# Patient Record
Sex: Female | Born: 1965
Health system: Southern US, Community
[De-identification: ages and names within clinical notes are randomized; demographics above are authoritative.]

## PROBLEM LIST (undated history)

## (undated) DIAGNOSIS — I639 Cerebral infarction, unspecified: Secondary | ICD-10-CM

## (undated) DIAGNOSIS — E119 Type 2 diabetes mellitus without complications: Secondary | ICD-10-CM

## (undated) DIAGNOSIS — E7800 Pure hypercholesterolemia, unspecified: Secondary | ICD-10-CM

## (undated) DIAGNOSIS — I10 Essential (primary) hypertension: Secondary | ICD-10-CM

## (undated) HISTORY — DX: Pure hypercholesterolemia, unspecified: E78.00

## (undated) HISTORY — DX: Type 2 diabetes mellitus without complications: E11.9

## (undated) HISTORY — PX: TUBAL LIGATION: SHX77

## (undated) HISTORY — DX: Cerebral infarction, unspecified: I63.9

## (undated) HISTORY — DX: Essential (primary) hypertension: I10

---

## 2015-01-08 ENCOUNTER — Other Ambulatory Visit: Payer: Self-pay | Admitting: Internal Medicine

## 2015-01-08 DIAGNOSIS — R1011 Right upper quadrant pain: Secondary | ICD-10-CM

## 2015-01-09 ENCOUNTER — Ambulatory Visit
Admission: RE | Admit: 2015-01-09 | Discharge: 2015-01-09 | Disposition: A | Discharge status: Home / Self Care | Payer: Self-pay | Source: Ambulatory Visit | Attending: Internal Medicine | Admitting: Internal Medicine

## 2015-01-09 DIAGNOSIS — R1011 Right upper quadrant pain: Secondary | ICD-10-CM

## 2015-01-15 ENCOUNTER — Other Ambulatory Visit: Payer: Self-pay

## 2015-04-08 ENCOUNTER — Ambulatory Visit: Payer: Federal, State, Local not specified - PPO | Admitting: Dietician

## 2015-04-10 ENCOUNTER — Ambulatory Visit: Payer: Federal, State, Local not specified - PPO | Admitting: Skilled Nursing Facility1

## 2015-04-20 ENCOUNTER — Encounter: Payer: Federal, State, Local not specified - PPO | Attending: Internal Medicine | Admitting: Dietician

## 2015-04-20 ENCOUNTER — Encounter: Payer: Self-pay | Admitting: Dietician

## 2015-04-20 VITALS — Ht 64.25 in | Wt 246.0 lb

## 2015-04-20 DIAGNOSIS — Z713 Dietary counseling and surveillance: Secondary | ICD-10-CM | POA: Diagnosis not present

## 2015-04-20 DIAGNOSIS — E669 Obesity, unspecified: Secondary | ICD-10-CM

## 2015-04-20 DIAGNOSIS — Z6841 Body Mass Index (BMI) 40.0 and over, adult: Secondary | ICD-10-CM | POA: Diagnosis not present

## 2015-04-20 NOTE — Progress Notes (Signed)
  Medical Nutrition Therapy:  Appt start time: 3532 end time:  1730.   Assessment:  Primary concerns today: Brenda Johnson is here today to learn how to choose foods better. Referred for obesity. Has been cutting back on what she is eating and having less sweets. Would like to lose 20 lbs to start with. Has lost weight in the past by cutting back and increasing activity.  Works in [employer] regular business hours. Lives with her husband and states that she does most of the food shopping and meal preparation. Misses 2-3 meals per week (mostly on weekend). Goes out to lunch everyday and goes out for dinner 1 x week.    Does not have a lot of fried foods. Sometimes portions are larger if she is hungrier.   Preferred Learning Style:   No preference indicated   Learning Readiness:   Ready  MEDICATIONS: see list   DIETARY INTAKE:  Usual eating pattern includes 2-3 meals and 1-2 snacks per day.  Avoided foods include: cauliflower, asparagus, collards, doesn't like a lot of green vegetables  24-hr recall:  B ( AM): biscuit- bacon, egg, cheese or sausage or breakfast bar with coffee with sweet and low and creamer and water Snk ( AM): none  L ( PM): goes out to eat - grilled chicken or chef salads or a burger Snk ( PM): chips, cookies, candy D ( PM): cube steak with string beans and mashed potatoes/rice or meatloaf or spaghetti or lasagna  Snk ( PM): sometimes ice cream Beverages: coffee, water, sweet tea, or diet drinks  Usual physical activity: walking some though not much lately d/t hot weather   Estimated energy needs: 1600 calories 180 g carbohydrates 120 g protein 44 g fat  Progress Towards Goal(s):  In progress.   Nutritional Diagnosis:  [place]-3.3 Overweight/obesity As related to hx of large portion sizes and consumption of sweet snacks.  As evidenced by BMI of 41.9.    Intervention:  Nutrition counseling provided. Plan: Plan to resume walking at home or at lunch when weather  cools off. (Aim to get 150 of activity per week). Aim to fill half of your plate with vegetables at lunch and dinner. Have a quarter of your plate protein and a quarter of your plate starch. Plan to bring lunch 2 x week. (sandwich with side salad). Try to make ranch dressing with non fat [place] Yogurt and [employer]. Bring some snacks with carbohydrates and protein (grapes and cheese, crackers with egg/chicken salad, oranges with boiled eggs). For breakfast - [facility] [employer], yogurt, or egg sandwich with [place] bacon.  Try using small plates for meals to help with portion sizes. Stop buying sugary sweets and chips. Try to make drinks sugar free.    Teaching Method Utilized:  Visual Auditory Hands on  Handouts given during visit include:  MyPlate Handout  15 g CHO Snacks  Barriers to learning/adherence to lifestyle change: none  Demonstrated degree of understanding via:  Teach Back   Monitoring/Evaluation:  Dietary intake, exercise, and body weight prn.

## 2015-04-20 NOTE — Patient Instructions (Addendum)
Plan to resume walking at home or at lunch when weather cools off. (Aim to get 150 of activity per week). Aim to fill half of your plate with vegetables at lunch and dinner. Have a quarter of your plate protein and a quarter of your plate starch. Plan to bring lunch 2 x week. (sandwich with side salad). Try to make ranch dressing with non fat [place] Yogurt and [employer]. Bring some snacks with carbohydrates and protein (grapes and cheese, crackers with egg/chicken salad, oranges with boiled eggs). For breakfast - [facility] [employer], yogurt, or egg sandwich with [place] bacon.  Try using small plates for meals to help with portion sizes. Stop buying sugary sweets and chips. Try to make drinks sugar free.

## 2015-05-06 ENCOUNTER — Ambulatory Visit: Payer: Federal, State, Local not specified - PPO | Admitting: Dietician

## 2016-04-05 DIAGNOSIS — E1165 Type 2 diabetes mellitus with hyperglycemia: Secondary | ICD-10-CM | POA: Diagnosis not present

## 2016-04-05 DIAGNOSIS — N390 Urinary tract infection, site not specified: Secondary | ICD-10-CM | POA: Diagnosis not present

## 2016-04-05 DIAGNOSIS — I10 Essential (primary) hypertension: Secondary | ICD-10-CM | POA: Diagnosis not present

## 2016-04-05 DIAGNOSIS — E559 Vitamin D deficiency, unspecified: Secondary | ICD-10-CM | POA: Diagnosis not present

## 2016-04-12 DIAGNOSIS — E785 Hyperlipidemia, unspecified: Secondary | ICD-10-CM | POA: Diagnosis not present

## 2016-04-12 DIAGNOSIS — E063 Autoimmune thyroiditis: Secondary | ICD-10-CM | POA: Diagnosis not present

## 2016-04-25 ENCOUNTER — Other Ambulatory Visit: Payer: Self-pay | Admitting: Internal Medicine

## 2016-05-25 DIAGNOSIS — Z1231 Encounter for screening mammogram for malignant neoplasm of breast: Secondary | ICD-10-CM | POA: Diagnosis not present

## 2016-06-02 DIAGNOSIS — K080 Exfoliation of teeth due to systemic causes: Secondary | ICD-10-CM | POA: Diagnosis not present

## 2016-08-02 DIAGNOSIS — I10 Essential (primary) hypertension: Secondary | ICD-10-CM | POA: Diagnosis not present

## 2016-08-10 DIAGNOSIS — E119 Type 2 diabetes mellitus without complications: Secondary | ICD-10-CM | POA: Diagnosis not present

## 2016-08-10 DIAGNOSIS — E782 Mixed hyperlipidemia: Secondary | ICD-10-CM | POA: Diagnosis not present

## 2016-08-10 DIAGNOSIS — E1165 Type 2 diabetes mellitus with hyperglycemia: Secondary | ICD-10-CM | POA: Diagnosis not present

## 2016-08-10 DIAGNOSIS — I10 Essential (primary) hypertension: Secondary | ICD-10-CM | POA: Diagnosis not present

## 2016-10-27 ENCOUNTER — Encounter: Payer: Self-pay | Admitting: Women's Health

## 2016-10-27 ENCOUNTER — Ambulatory Visit (INDEPENDENT_AMBULATORY_CARE_PROVIDER_SITE_OTHER): Payer: Federal, State, Local not specified - PPO | Admitting: Women's Health

## 2016-10-27 VITALS — BP 130/88 | Ht 64.75 in | Wt 242.0 lb

## 2016-10-27 DIAGNOSIS — Z1151 Encounter for screening for human papillomavirus (HPV): Secondary | ICD-10-CM

## 2016-10-27 DIAGNOSIS — Z01419 Encounter for gynecological examination (general) (routine) without abnormal findings: Secondary | ICD-10-CM

## 2016-10-27 DIAGNOSIS — E038 Other specified hypothyroidism: Secondary | ICD-10-CM

## 2016-10-27 DIAGNOSIS — E119 Type 2 diabetes mellitus without complications: Secondary | ICD-10-CM | POA: Insufficient documentation

## 2016-10-27 DIAGNOSIS — E039 Hypothyroidism, unspecified: Secondary | ICD-10-CM | POA: Insufficient documentation

## 2016-10-27 NOTE — Progress Notes (Signed)
Brenda Johnson [birth year] [id number]    History:    Presents for new patient annual exam.  Postmenopausal greater than 2 years on no HRT with no bleeding. Reports normal Pap and mammogram history. History of fibrocystic breasts with negative biopsies. Hypertension, hypothyroidism managed by primary care. Has not had a screening colonoscopy.  Past medical history, past surgical history, family history and social history were all reviewed and documented in the EPIC chart. 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 2 children both doing well.  ROS:  A ROS was performed and pertinent positives and negatives are included.  Exam:  Vitals:   10/27/16 1124  BP: 130/88  Weight: 242 lb (109.8 kg)  Height: 5' 4.75" (1.645 m)   Body mass index is 40.58 kg/m.   General appearance:  Normal Thyroid:  Symmetrical, normal in size, without palpable masses or nodularity. Respiratory  Auscultation:  Clear without wheezing or rhonchi Cardiovascular  Auscultation:  Regular rate, without rubs, murmurs or gallops  Edema/varicosities:  Not grossly evident Abdominal  Soft,nontender, without masses, guarding or rebound.  Liver/spleen:  No organomegaly noted  Hernia:  None appreciated  Skin  Inspection:  Grossly normal   Breasts: Examined lying and sitting.     Right: Without masses, retractions, discharge or axillary adenopathy.     Left: Without masses, retractions, discharge or axillary adenopathy. Gentitourinary   Inguinal/mons:  Normal without inguinal adenopathy  External genitalia:  Normal  BUS/Urethra/Skene's glands:  Normal  Vagina:  Normal  Cervix:  Normal  Uterus:   normal in size, shape and contour.  Midline and mobile  Adnexa/parametria:     Rt: Without masses or tenderness.   Lt: Without masses or tenderness.  Anus and perineum: Normal  Digital rectal exam: Normal sphincter tone without palpated masses or tenderness  Assessment/Plan:  51 y.o. M WF G2 P2  for annual exam.      Postmenopausal/no bleeding/no HRT Morbid obesity Hypertension/hypothyroidism/Hypercholesterolemia/ diabetes-primary care manages labs and meds  Plan: Screening colonoscopy reviewed and encouraged, [facility] information given instructed to schedule. SBE's, continue annual screening mammogram, 3-D tomography reviewed and encouraged, calcium rich diet, vitamin D 2000 daily encouraged. Reviewed importance of increasing regular exercise and decreasing calories for weight loss, weightbearing exercise encouraged, home safety and fall prevention discussed. Pap with HR HPV typing, new screening guidelines reviewed.    Huel Cote [facility], 12:52 PM 10/27/2016

## 2016-10-27 NOTE — Patient Instructions (Signed)
[facility]  Dr Carlean Purl  [phone]  Colonoscopy  Carbohydrate Counting for Diabetes Mellitus, Adult Carbohydrate counting is a method for keeping track of how many carbohydrates you eat. Eating carbohydrates naturally increases the amount of sugar (glucose) in the blood. Counting how many carbohydrates you eat helps keep your blood glucose within normal limits, which helps you manage your diabetes (diabetes mellitus). It is important to know how many carbohydrates you can safely have in each meal. This is different for every person. A diet and nutrition specialist (registered dietitian) can help you make a meal plan and calculate how many carbohydrates you should have at each meal and snack. Carbohydrates are found in the following foods:  Grains, such as breads and cereals.  Dried beans and soy products.  Starchy vegetables, such as potatoes, peas, and corn.  Fruit and fruit juices.  Milk and yogurt.  Sweets and snack foods, such as cake, cookies, candy, chips, and soft drinks. How do I count carbohydrates? There are two ways to count carbohydrates in food. You can use either of the methods or a combination of both. Reading "Nutrition Facts" on packaged food  The "Nutrition Facts" list is included on the labels of almost all packaged foods and beverages in the [place] It includes:  The serving size.  Information about nutrients in each serving, including the grams (g) of carbohydrate per serving. To use the "Nutrition Facts":  Decide how many servings you will have.  Multiply the number of servings by the number of carbohydrates per serving.  The resulting number is the total amount of carbohydrates that you will be having. Learning standard serving sizes of other foods  When you eat foods containing carbohydrates that are not packaged or do not include "Nutrition Facts" on the label, you need to measure the servings in order to count the amount of carbohydrates:  Measure the foods  that you will eat with a food scale or measuring cup, if needed.  Decide how many standard-size servings you will eat.  Multiply the number of servings by 15. Most carbohydrate-rich foods have about 15 g of carbohydrates per serving.  For example, if you eat 8 oz (170 g) of strawberries, you will have eaten 2 servings and 30 g of carbohydrates (2 servings x 15 g = 30 g).  For foods that have more than one food mixed, such as soups and casseroles, you must count the carbohydrates in each food that is included. The following list contains standard serving sizes of common carbohydrate-rich foods. Each of these servings has about 15 g of carbohydrates:   hamburger bun or  English muffin.   oz (15 mL) syrup.   oz (14 g) jelly.  1 slice of bread.  1 six-inch tortilla.  3 oz (85 g) cooked rice or pasta.  4 oz (113 g) cooked dried beans.  4 oz (113 g) starchy vegetable, such as peas, corn, or potatoes.  4 oz (113 g) hot cereal.  4 oz (113 g) mashed potatoes or  of a large baked potato.  4 oz (113 g) canned or frozen fruit.  4 oz (120 mL) fruit juice.  4-6 crackers.  6 chicken nuggets.  6 oz (170 g) unsweetened dry cereal.  6 oz (170 g) plain fat-free yogurt or yogurt sweetened with artificial sweeteners.  8 oz (240 mL) milk.  8 oz (170 g) fresh fruit or one small piece of fruit.  24 oz (680 g) popped popcorn. Example of carbohydrate counting Sample  meal  3 oz (85 g) chicken breast.  6 oz (170 g) brown rice.  4 oz (113 g) corn.  8 oz (240 mL) milk.  8 oz (170 g) strawberries with sugar-free whipped topping. Carbohydrate calculation 1. Identify the foods that contain carbohydrates:  Rice.  Corn.  Milk.  Strawberries. 2. Calculate how many servings you have of each food:  2 servings rice.  1 serving corn.  1 serving milk.  1 serving strawberries. 3. Multiply each number of servings by 15 g:  2 servings rice x 15 g = 30 g.  1 serving corn x  15 g = 15 g.  1 serving milk x 15 g = 15 g.  1 serving strawberries x 15 g = 15 g. 4. Add together all of the amounts to find the total grams of carbohydrates eaten:  30 g + 15 g + 15 g + 15 g = 75 g of carbohydrates total. This information is not intended to replace advice given to you by your health care provider. Make sure you discuss any questions you have with your health care provider. Document Released: 08/22/2005 Document Revised: 03/11/2016 Document Reviewed: 02/03/2016 Elsevier Interactive Patient Education  2017 [place] Maintenance for Postmenopausal Women Introduction Menopause is a normal process in which your reproductive ability comes to an end. This process happens gradually over a span of months to years, usually between the ages of 86 and 83. Menopause is complete when you have missed 12 consecutive menstrual periods. It is important to talk with your health care provider about some of the most common conditions that affect postmenopausal women, such as heart disease, cancer, and bone loss (osteoporosis). Adopting a healthy lifestyle and getting preventive care can help to promote your health and wellness. Those actions can also lower your chances of developing some of these common conditions. What should I know about menopause? During menopause, you may experience a number of symptoms, such as:  Moderate-to-severe hot flashes.  Night sweats.  Decrease in sex drive.  Mood swings.  Headaches.  Tiredness.  Irritability.  Memory problems.  Insomnia. Choosing to treat or not to treat menopausal changes is an individual decision that you make with your health care provider. What should I know about hormone replacement therapy and supplements? Hormone therapy products are effective for treating symptoms that are associated with menopause, such as hot flashes and night sweats. Hormone replacement carries certain risks, especially as you become older. If  you are thinking about using estrogen or estrogen with progestin treatments, discuss the benefits and risks with your health care provider. What should I know about heart disease and stroke? Heart disease, heart attack, and stroke become more likely as you age. This may be due, in part, to the hormonal changes that your body experiences during menopause. These can affect how your body processes dietary fats, triglycerides, and cholesterol. Heart attack and stroke are both medical emergencies. There are many things that you can do to help prevent heart disease and stroke:  Have your blood pressure checked at least every 1-2 years. High blood pressure causes heart disease and increases the risk of stroke.  If you are 66-26 years old, ask your health care provider if you should take aspirin to prevent a heart attack or a stroke.  Do not use any tobacco products, including cigarettes, chewing tobacco, or electronic cigarettes. If you need help quitting, ask your health care provider.  It is important to eat a healthy diet  and maintain a healthy weight.  Be sure to include plenty of vegetables, fruits, low-fat dairy products, and lean protein.  Avoid eating foods that are high in solid fats, added sugars, or salt (sodium).  Get regular exercise. This is one of the most important things that you can do for your health.  Try to exercise for at least 150 minutes each week. The type of exercise that you do should increase your heart rate and make you sweat. This is known as moderate-intensity exercise.  Try to do strengthening exercises at least twice each week. Do these in addition to the moderate-intensity exercise.  Know your numbers.Ask your health care provider to check your cholesterol and your blood glucose. Continue to have your blood tested as directed by your health care provider. What should I know about cancer screening? There are several types of cancer. Take the following steps to  reduce your risk and to catch any cancer development as early as possible. Breast Cancer  Practice breast self-awareness.  This means understanding how your breasts normally appear and feel.  It also means doing regular breast self-exams. Let your health care provider know about any changes, no matter how small.  If you are 79 or older, have a clinician do a breast exam (clinical breast exam or CBE) every year. Depending on your age, family history, and medical history, it may be recommended that you also have a yearly breast X-ray (mammogram).  If you have a family history of breast cancer, talk with your health care provider about genetic screening.  If you are at high risk for breast cancer, talk with your health care provider about having an MRI and a mammogram every year.  Breast cancer (BRCA) gene test is recommended for women who have family members with BRCA-related cancers. Results of the assessment will determine the need for genetic counseling and BRCA1 and for BRCA2 testing. BRCA-related cancers include these types:  Breast. This occurs in males or females.  Ovarian.  Tubal. This may also be called fallopian tube cancer.  Cancer of the abdominal or pelvic lining (peritoneal cancer).  Prostate.  Pancreatic. Cervical, Uterine, and Ovarian Cancer  Your health care provider may recommend that you be screened regularly for cancer of the pelvic organs. These include your ovaries, uterus, and vagina. This screening involves a pelvic exam, which includes checking for microscopic changes to the surface of your cervix (Pap test).  For women ages 21-65, health care providers may recommend a pelvic exam and a Pap test every three years. For women ages 56-65, they may recommend the Pap test and pelvic exam, combined with testing for human papilloma virus (HPV), every five years. Some types of HPV increase your risk of cervical cancer. Testing for HPV may also be done on women of any age  who have unclear Pap test results.  Other health care providers may not recommend any screening for nonpregnant women who are considered low risk for pelvic cancer and have no symptoms. Ask your health care provider if a screening pelvic exam is right for you.  If you have had past treatment for cervical cancer or a condition that could lead to cancer, you need Pap tests and screening for cancer for at least 20 years after your treatment. If Pap tests have been discontinued for you, your risk factors (such as having a new sexual partner) need to be reassessed to determine if you should start having screenings again. Some women have medical problems that increase the  chance of getting cervical cancer. In these cases, your health care provider may recommend that you have screening and Pap tests more often.  If you have a family history of uterine cancer or ovarian cancer, talk with your health care provider about genetic screening.  If you have vaginal bleeding after reaching menopause, tell your health care provider.  There are currently no reliable tests available to screen for ovarian cancer. Lung Cancer  Lung cancer screening is recommended for adults 13-71 years old who are at high risk for lung cancer because of a history of smoking. A yearly low-dose CT scan of the lungs is recommended if you:  Currently smoke.  Have a history of at least 30 pack-years of smoking and you currently smoke or have quit within the past 15 years. A pack-year is smoking an average of one pack of cigarettes per day for one year. Yearly screening should:  Continue until it has been 15 years since you quit.  Stop if you develop a health problem that would prevent you from having lung cancer treatment. Colorectal Cancer  This type of cancer can be detected and can often be prevented.  Routine colorectal cancer screening usually begins at age 69 and continues through age 22.  If you have risk factors for colon  cancer, your health care provider may recommend that you be screened at an earlier age.  If you have a family history of colorectal cancer, talk with your health care provider about genetic screening.  Your health care provider may also recommend using home test kits to check for hidden blood in your stool.  A small camera at the end of a tube can be used to examine your colon directly (sigmoidoscopy or colonoscopy). This is done to check for the earliest forms of colorectal cancer.  Direct examination of the colon should be repeated every 5-10 years until age 64. However, if early forms of precancerous polyps or small growths are found or if you have a family history or genetic risk for colorectal cancer, you may need to be screened more often. Skin Cancer  Check your skin from head to toe regularly.  Monitor any moles. Be sure to tell your health care provider:  About any new moles or changes in moles, especially if there is a change in a mole's shape or color.  If you have a mole that is larger than the size of a pencil eraser.  If any of your family members has a history of skin cancer, especially at a young age, talk with your health care provider about genetic screening.  Always use sunscreen. Apply sunscreen liberally and repeatedly throughout the day.  Whenever you are outside, protect yourself by wearing long sleeves, pants, a wide-brimmed hat, and sunglasses. What should I know about osteoporosis? Osteoporosis is a condition in which bone destruction happens more quickly than new bone creation. After menopause, you may be at an increased risk for osteoporosis. To help prevent osteoporosis or the bone fractures that can happen because of osteoporosis, the following is recommended:  If you are 69-70 years old, get at least 1,000 mg of calcium and at least 600 mg of vitamin D per day.  If you are older than age 32 but younger than age 57, get at least 1,200 mg of calcium and at  least 600 mg of vitamin D per day.  If you are older than age 69, get at least 1,200 mg of calcium and at least 800 mg of  vitamin D per day. Smoking and excessive alcohol intake increase the risk of osteoporosis. Eat foods that are rich in calcium and vitamin D, and do weight-bearing exercises several times each week as directed by your health care provider. What should I know about how menopause affects my mental health? Depression may occur at any age, but it is more common as you become older. Common symptoms of depression include:  Low or sad mood.  Changes in sleep patterns.  Changes in appetite or eating patterns.  Feeling an overall lack of motivation or enjoyment of activities that you previously enjoyed.  Frequent crying spells. Talk with your health care provider if you think that you are experiencing depression. What should I know about immunizations? It is important that you get and maintain your immunizations. These include:  Tetanus, diphtheria, and pertussis (Tdap) booster vaccine.  Influenza every year before the flu season begins.  Pneumonia vaccine.  Shingles vaccine. Your health care provider may also recommend other immunizations. This information is not intended to replace advice given to you by your health care provider. Make sure you discuss any questions you have with your health care provider. Document Released: 10/14/2005 Document Revised: 03/11/2016 Document Reviewed: 05/26/2015  2017 Elsevier

## 2016-10-28 LAB — PAP, TP IMAGING W/ HPV RNA, RFLX HPV TYPE 16,18/45: HPV MRNA, HIGH RISK: NOT DETECTED

## 2016-11-24 DIAGNOSIS — E063 Autoimmune thyroiditis: Secondary | ICD-10-CM | POA: Diagnosis not present

## 2016-11-24 DIAGNOSIS — Z0000 Encounter for general adult medical examination without abnormal findings: Secondary | ICD-10-CM | POA: Diagnosis not present

## 2016-11-24 DIAGNOSIS — E1165 Type 2 diabetes mellitus with hyperglycemia: Secondary | ICD-10-CM | POA: Diagnosis not present

## 2016-11-30 DIAGNOSIS — E1165 Type 2 diabetes mellitus with hyperglycemia: Secondary | ICD-10-CM | POA: Diagnosis not present

## 2016-11-30 DIAGNOSIS — K760 Fatty (change of) liver, not elsewhere classified: Secondary | ICD-10-CM | POA: Diagnosis not present

## 2016-11-30 DIAGNOSIS — Z0000 Encounter for general adult medical examination without abnormal findings: Secondary | ICD-10-CM | POA: Diagnosis not present

## 2016-11-30 DIAGNOSIS — E1129 Type 2 diabetes mellitus with other diabetic kidney complication: Secondary | ICD-10-CM | POA: Diagnosis not present

## 2016-12-08 DIAGNOSIS — K080 Exfoliation of teeth due to systemic causes: Secondary | ICD-10-CM | POA: Diagnosis not present

## 2017-06-07 DIAGNOSIS — E785 Hyperlipidemia, unspecified: Secondary | ICD-10-CM | POA: Diagnosis not present

## 2017-06-07 DIAGNOSIS — E1165 Type 2 diabetes mellitus with hyperglycemia: Secondary | ICD-10-CM | POA: Diagnosis not present

## 2017-06-14 DIAGNOSIS — E1165 Type 2 diabetes mellitus with hyperglycemia: Secondary | ICD-10-CM | POA: Diagnosis not present

## 2017-06-14 DIAGNOSIS — E782 Mixed hyperlipidemia: Secondary | ICD-10-CM | POA: Diagnosis not present

## 2017-06-14 DIAGNOSIS — R1011 Right upper quadrant pain: Secondary | ICD-10-CM | POA: Diagnosis not present

## 2017-06-14 DIAGNOSIS — E1129 Type 2 diabetes mellitus with other diabetic kidney complication: Secondary | ICD-10-CM | POA: Diagnosis not present

## 2017-06-16 ENCOUNTER — Other Ambulatory Visit: Payer: Self-pay | Admitting: Internal Medicine

## 2017-06-16 DIAGNOSIS — R1011 Right upper quadrant pain: Secondary | ICD-10-CM

## 2017-06-20 DIAGNOSIS — K080 Exfoliation of teeth due to systemic causes: Secondary | ICD-10-CM | POA: Diagnosis not present

## 2017-06-21 ENCOUNTER — Ambulatory Visit
Admission: RE | Admit: 2017-06-21 | Discharge: 2017-06-21 | Disposition: A | Discharge status: Home / Self Care | Payer: Federal, State, Local not specified - PPO | Source: Ambulatory Visit | Attending: Internal Medicine | Admitting: Internal Medicine

## 2017-06-21 DIAGNOSIS — R197 Diarrhea, unspecified: Secondary | ICD-10-CM | POA: Diagnosis not present

## 2017-06-21 DIAGNOSIS — R1011 Right upper quadrant pain: Secondary | ICD-10-CM

## 2017-07-24 DIAGNOSIS — K080 Exfoliation of teeth due to systemic causes: Secondary | ICD-10-CM | POA: Diagnosis not present

## 2017-08-02 DIAGNOSIS — Z1211 Encounter for screening for malignant neoplasm of colon: Secondary | ICD-10-CM | POA: Diagnosis not present

## 2017-08-02 DIAGNOSIS — K760 Fatty (change of) liver, not elsewhere classified: Secondary | ICD-10-CM | POA: Diagnosis not present

## 2017-08-02 DIAGNOSIS — R945 Abnormal results of liver function studies: Secondary | ICD-10-CM | POA: Diagnosis not present

## 2017-08-02 DIAGNOSIS — Z01818 Encounter for other preprocedural examination: Secondary | ICD-10-CM | POA: Diagnosis not present

## 2017-09-05 DIAGNOSIS — C50919 Malignant neoplasm of unspecified site of unspecified female breast: Secondary | ICD-10-CM

## 2017-09-05 HISTORY — PX: BREAST SURGERY: SHX581

## 2017-09-05 HISTORY — DX: Malignant neoplasm of unspecified site of unspecified female breast: C50.919

## 2017-09-30 DIAGNOSIS — Z1231 Encounter for screening mammogram for malignant neoplasm of breast: Secondary | ICD-10-CM | POA: Diagnosis not present

## 2017-10-06 DIAGNOSIS — C801 Malignant (primary) neoplasm, unspecified: Secondary | ICD-10-CM

## 2017-10-06 HISTORY — DX: Malignant (primary) neoplasm, unspecified: C80.1

## 2017-10-10 DIAGNOSIS — N6321 Unspecified lump in the left breast, upper outer quadrant: Secondary | ICD-10-CM | POA: Diagnosis not present

## 2017-10-10 DIAGNOSIS — R928 Other abnormal and inconclusive findings on diagnostic imaging of breast: Secondary | ICD-10-CM | POA: Diagnosis not present

## 2017-10-10 DIAGNOSIS — R590 Localized enlarged lymph nodes: Secondary | ICD-10-CM | POA: Diagnosis not present

## 2017-10-12 DIAGNOSIS — N6320 Unspecified lump in the left breast, unspecified quadrant: Secondary | ICD-10-CM | POA: Diagnosis not present

## 2017-10-12 DIAGNOSIS — C773 Secondary and unspecified malignant neoplasm of axilla and upper limb lymph nodes: Secondary | ICD-10-CM | POA: Diagnosis not present

## 2017-10-12 DIAGNOSIS — C50412 Malignant neoplasm of upper-outer quadrant of left female breast: Secondary | ICD-10-CM | POA: Diagnosis not present

## 2017-10-12 DIAGNOSIS — R590 Localized enlarged lymph nodes: Secondary | ICD-10-CM | POA: Diagnosis not present

## 2017-10-12 DIAGNOSIS — N6321 Unspecified lump in the left breast, upper outer quadrant: Secondary | ICD-10-CM | POA: Diagnosis not present

## 2017-10-17 DIAGNOSIS — C50412 Malignant neoplasm of upper-outer quadrant of left female breast: Secondary | ICD-10-CM | POA: Diagnosis not present

## 2017-10-17 DIAGNOSIS — Z170 Estrogen receptor positive status [ER+]: Secondary | ICD-10-CM | POA: Insufficient documentation

## 2017-10-20 DIAGNOSIS — I10 Essential (primary) hypertension: Secondary | ICD-10-CM | POA: Diagnosis not present

## 2017-10-20 DIAGNOSIS — Z170 Estrogen receptor positive status [ER+]: Secondary | ICD-10-CM | POA: Diagnosis not present

## 2017-10-20 DIAGNOSIS — D0512 Intraductal carcinoma in situ of left breast: Secondary | ICD-10-CM | POA: Diagnosis not present

## 2017-10-20 DIAGNOSIS — C773 Secondary and unspecified malignant neoplasm of axilla and upper limb lymph nodes: Secondary | ICD-10-CM | POA: Diagnosis not present

## 2017-10-20 DIAGNOSIS — Z87891 Personal history of nicotine dependence: Secondary | ICD-10-CM | POA: Diagnosis not present

## 2017-10-20 DIAGNOSIS — E039 Hypothyroidism, unspecified: Secondary | ICD-10-CM | POA: Diagnosis not present

## 2017-10-20 DIAGNOSIS — Z8041 Family history of malignant neoplasm of ovary: Secondary | ICD-10-CM | POA: Diagnosis not present

## 2017-10-20 DIAGNOSIS — C50412 Malignant neoplasm of upper-outer quadrant of left female breast: Secondary | ICD-10-CM | POA: Diagnosis not present

## 2017-10-20 DIAGNOSIS — Z800 Family history of malignant neoplasm of digestive organs: Secondary | ICD-10-CM | POA: Diagnosis not present

## 2017-10-25 DIAGNOSIS — Z6841 Body Mass Index (BMI) 40.0 and over, adult: Secondary | ICD-10-CM | POA: Diagnosis not present

## 2017-10-25 DIAGNOSIS — Z888 Allergy status to other drugs, medicaments and biological substances status: Secondary | ICD-10-CM | POA: Diagnosis not present

## 2017-10-25 DIAGNOSIS — E039 Hypothyroidism, unspecified: Secondary | ICD-10-CM | POA: Diagnosis not present

## 2017-10-25 DIAGNOSIS — K219 Gastro-esophageal reflux disease without esophagitis: Secondary | ICD-10-CM | POA: Diagnosis not present

## 2017-10-25 DIAGNOSIS — Z7984 Long term (current) use of oral hypoglycemic drugs: Secondary | ICD-10-CM | POA: Diagnosis not present

## 2017-10-25 DIAGNOSIS — Z87891 Personal history of nicotine dependence: Secondary | ICD-10-CM | POA: Diagnosis not present

## 2017-10-25 DIAGNOSIS — Z452 Encounter for adjustment and management of vascular access device: Secondary | ICD-10-CM | POA: Diagnosis not present

## 2017-10-25 DIAGNOSIS — I10 Essential (primary) hypertension: Secondary | ICD-10-CM | POA: Diagnosis not present

## 2017-10-25 DIAGNOSIS — K760 Fatty (change of) liver, not elsewhere classified: Secondary | ICD-10-CM | POA: Diagnosis not present

## 2017-10-25 DIAGNOSIS — C50412 Malignant neoplasm of upper-outer quadrant of left female breast: Secondary | ICD-10-CM | POA: Diagnosis not present

## 2017-10-25 DIAGNOSIS — E119 Type 2 diabetes mellitus without complications: Secondary | ICD-10-CM | POA: Diagnosis not present

## 2017-10-25 DIAGNOSIS — Z170 Estrogen receptor positive status [ER+]: Secondary | ICD-10-CM | POA: Diagnosis not present

## 2017-10-27 DIAGNOSIS — I10 Essential (primary) hypertension: Secondary | ICD-10-CM | POA: Diagnosis not present

## 2017-10-27 DIAGNOSIS — C50412 Malignant neoplasm of upper-outer quadrant of left female breast: Secondary | ICD-10-CM | POA: Diagnosis not present

## 2017-10-27 DIAGNOSIS — Z87891 Personal history of nicotine dependence: Secondary | ICD-10-CM | POA: Diagnosis not present

## 2017-10-27 DIAGNOSIS — Z170 Estrogen receptor positive status [ER+]: Secondary | ICD-10-CM | POA: Diagnosis not present

## 2017-10-27 DIAGNOSIS — E039 Hypothyroidism, unspecified: Secondary | ICD-10-CM | POA: Diagnosis not present

## 2017-10-30 ENCOUNTER — Encounter: Payer: Federal, State, Local not specified - PPO | Admitting: Women's Health

## 2017-10-30 DIAGNOSIS — N281 Cyst of kidney, acquired: Secondary | ICD-10-CM | POA: Diagnosis not present

## 2017-10-30 DIAGNOSIS — I358 Other nonrheumatic aortic valve disorders: Secondary | ICD-10-CM | POA: Diagnosis not present

## 2017-10-30 DIAGNOSIS — C50919 Malignant neoplasm of unspecified site of unspecified female breast: Secondary | ICD-10-CM | POA: Diagnosis not present

## 2017-10-30 DIAGNOSIS — R590 Localized enlarged lymph nodes: Secondary | ICD-10-CM | POA: Diagnosis not present

## 2017-10-30 DIAGNOSIS — C50412 Malignant neoplasm of upper-outer quadrant of left female breast: Secondary | ICD-10-CM | POA: Diagnosis not present

## 2017-10-30 DIAGNOSIS — R918 Other nonspecific abnormal finding of lung field: Secondary | ICD-10-CM | POA: Diagnosis not present

## 2017-10-30 DIAGNOSIS — K760 Fatty (change of) liver, not elsewhere classified: Secondary | ICD-10-CM | POA: Diagnosis not present

## 2017-10-30 DIAGNOSIS — K5730 Diverticulosis of large intestine without perforation or abscess without bleeding: Secondary | ICD-10-CM | POA: Diagnosis not present

## 2017-10-30 DIAGNOSIS — R948 Abnormal results of function studies of other organs and systems: Secondary | ICD-10-CM | POA: Diagnosis not present

## 2017-10-30 DIAGNOSIS — J439 Emphysema, unspecified: Secondary | ICD-10-CM | POA: Diagnosis not present

## 2017-10-30 DIAGNOSIS — Z170 Estrogen receptor positive status [ER+]: Secondary | ICD-10-CM | POA: Diagnosis not present

## 2017-10-31 DIAGNOSIS — Z6841 Body Mass Index (BMI) 40.0 and over, adult: Secondary | ICD-10-CM | POA: Diagnosis not present

## 2017-10-31 DIAGNOSIS — C50412 Malignant neoplasm of upper-outer quadrant of left female breast: Secondary | ICD-10-CM | POA: Diagnosis not present

## 2017-10-31 DIAGNOSIS — D0511 Intraductal carcinoma in situ of right breast: Secondary | ICD-10-CM | POA: Diagnosis not present

## 2017-10-31 DIAGNOSIS — Z5111 Encounter for antineoplastic chemotherapy: Secondary | ICD-10-CM | POA: Diagnosis not present

## 2017-10-31 DIAGNOSIS — Z170 Estrogen receptor positive status [ER+]: Secondary | ICD-10-CM | POA: Diagnosis not present

## 2017-11-06 DIAGNOSIS — Z1379 Encounter for other screening for genetic and chromosomal anomalies: Secondary | ICD-10-CM | POA: Insufficient documentation

## 2017-11-10 DIAGNOSIS — Z5111 Encounter for antineoplastic chemotherapy: Secondary | ICD-10-CM | POA: Diagnosis not present

## 2017-11-10 DIAGNOSIS — Z170 Estrogen receptor positive status [ER+]: Secondary | ICD-10-CM | POA: Diagnosis not present

## 2017-11-10 DIAGNOSIS — R197 Diarrhea, unspecified: Secondary | ICD-10-CM | POA: Diagnosis not present

## 2017-11-10 DIAGNOSIS — C50412 Malignant neoplasm of upper-outer quadrant of left female breast: Secondary | ICD-10-CM | POA: Diagnosis not present

## 2017-11-10 DIAGNOSIS — R05 Cough: Secondary | ICD-10-CM | POA: Diagnosis not present

## 2017-11-10 DIAGNOSIS — B379 Candidiasis, unspecified: Secondary | ICD-10-CM | POA: Diagnosis not present

## 2017-11-12 DIAGNOSIS — R300 Dysuria: Secondary | ICD-10-CM | POA: Diagnosis not present

## 2017-11-12 DIAGNOSIS — N3001 Acute cystitis with hematuria: Secondary | ICD-10-CM | POA: Diagnosis not present

## 2017-11-17 DIAGNOSIS — Z170 Estrogen receptor positive status [ER+]: Secondary | ICD-10-CM | POA: Diagnosis not present

## 2017-11-17 DIAGNOSIS — C50412 Malignant neoplasm of upper-outer quadrant of left female breast: Secondary | ICD-10-CM | POA: Diagnosis not present

## 2017-11-17 DIAGNOSIS — R05 Cough: Secondary | ICD-10-CM | POA: Diagnosis not present

## 2017-11-17 DIAGNOSIS — R197 Diarrhea, unspecified: Secondary | ICD-10-CM | POA: Diagnosis not present

## 2017-11-17 DIAGNOSIS — Z5111 Encounter for antineoplastic chemotherapy: Secondary | ICD-10-CM | POA: Diagnosis not present

## 2017-11-17 DIAGNOSIS — B379 Candidiasis, unspecified: Secondary | ICD-10-CM | POA: Diagnosis not present

## 2017-11-24 DIAGNOSIS — R05 Cough: Secondary | ICD-10-CM | POA: Diagnosis not present

## 2017-11-24 DIAGNOSIS — C773 Secondary and unspecified malignant neoplasm of axilla and upper limb lymph nodes: Secondary | ICD-10-CM | POA: Diagnosis not present

## 2017-11-24 DIAGNOSIS — Z5111 Encounter for antineoplastic chemotherapy: Secondary | ICD-10-CM | POA: Diagnosis not present

## 2017-11-24 DIAGNOSIS — C50412 Malignant neoplasm of upper-outer quadrant of left female breast: Secondary | ICD-10-CM | POA: Diagnosis not present

## 2017-11-24 DIAGNOSIS — R197 Diarrhea, unspecified: Secondary | ICD-10-CM | POA: Diagnosis not present

## 2017-11-24 DIAGNOSIS — D0512 Intraductal carcinoma in situ of left breast: Secondary | ICD-10-CM | POA: Diagnosis not present

## 2017-11-24 DIAGNOSIS — B372 Candidiasis of skin and nail: Secondary | ICD-10-CM | POA: Diagnosis not present

## 2017-11-24 DIAGNOSIS — Z170 Estrogen receptor positive status [ER+]: Secondary | ICD-10-CM | POA: Diagnosis not present

## 2017-11-24 DIAGNOSIS — B379 Candidiasis, unspecified: Secondary | ICD-10-CM | POA: Diagnosis not present

## 2017-12-01 DIAGNOSIS — R197 Diarrhea, unspecified: Secondary | ICD-10-CM | POA: Diagnosis not present

## 2017-12-01 DIAGNOSIS — R05 Cough: Secondary | ICD-10-CM | POA: Diagnosis not present

## 2017-12-01 DIAGNOSIS — Z170 Estrogen receptor positive status [ER+]: Secondary | ICD-10-CM | POA: Diagnosis not present

## 2017-12-01 DIAGNOSIS — C50412 Malignant neoplasm of upper-outer quadrant of left female breast: Secondary | ICD-10-CM | POA: Diagnosis not present

## 2017-12-01 DIAGNOSIS — B379 Candidiasis, unspecified: Secondary | ICD-10-CM | POA: Diagnosis not present

## 2017-12-01 DIAGNOSIS — Z5111 Encounter for antineoplastic chemotherapy: Secondary | ICD-10-CM | POA: Diagnosis not present

## 2017-12-08 DIAGNOSIS — Z5111 Encounter for antineoplastic chemotherapy: Secondary | ICD-10-CM | POA: Diagnosis not present

## 2017-12-08 DIAGNOSIS — Z170 Estrogen receptor positive status [ER+]: Secondary | ICD-10-CM | POA: Diagnosis not present

## 2017-12-08 DIAGNOSIS — C773 Secondary and unspecified malignant neoplasm of axilla and upper limb lymph nodes: Secondary | ICD-10-CM | POA: Diagnosis not present

## 2017-12-08 DIAGNOSIS — C50412 Malignant neoplasm of upper-outer quadrant of left female breast: Secondary | ICD-10-CM | POA: Diagnosis not present

## 2017-12-15 DIAGNOSIS — R110 Nausea: Secondary | ICD-10-CM | POA: Diagnosis not present

## 2017-12-15 DIAGNOSIS — B372 Candidiasis of skin and nail: Secondary | ICD-10-CM | POA: Diagnosis not present

## 2017-12-15 DIAGNOSIS — Z5111 Encounter for antineoplastic chemotherapy: Secondary | ICD-10-CM | POA: Diagnosis not present

## 2017-12-15 DIAGNOSIS — C773 Secondary and unspecified malignant neoplasm of axilla and upper limb lymph nodes: Secondary | ICD-10-CM | POA: Diagnosis not present

## 2017-12-15 DIAGNOSIS — C50412 Malignant neoplasm of upper-outer quadrant of left female breast: Secondary | ICD-10-CM | POA: Diagnosis not present

## 2017-12-15 DIAGNOSIS — Z170 Estrogen receptor positive status [ER+]: Secondary | ICD-10-CM | POA: Diagnosis not present

## 2017-12-21 DIAGNOSIS — C773 Secondary and unspecified malignant neoplasm of axilla and upper limb lymph nodes: Secondary | ICD-10-CM | POA: Diagnosis not present

## 2017-12-21 DIAGNOSIS — Z5111 Encounter for antineoplastic chemotherapy: Secondary | ICD-10-CM | POA: Diagnosis not present

## 2017-12-21 DIAGNOSIS — Z170 Estrogen receptor positive status [ER+]: Secondary | ICD-10-CM | POA: Diagnosis not present

## 2017-12-21 DIAGNOSIS — C50412 Malignant neoplasm of upper-outer quadrant of left female breast: Secondary | ICD-10-CM | POA: Diagnosis not present

## 2017-12-29 DIAGNOSIS — C773 Secondary and unspecified malignant neoplasm of axilla and upper limb lymph nodes: Secondary | ICD-10-CM | POA: Diagnosis not present

## 2017-12-29 DIAGNOSIS — Z170 Estrogen receptor positive status [ER+]: Secondary | ICD-10-CM | POA: Diagnosis not present

## 2017-12-29 DIAGNOSIS — Z5111 Encounter for antineoplastic chemotherapy: Secondary | ICD-10-CM | POA: Diagnosis not present

## 2017-12-29 DIAGNOSIS — C50412 Malignant neoplasm of upper-outer quadrant of left female breast: Secondary | ICD-10-CM | POA: Diagnosis not present

## 2018-01-04 DIAGNOSIS — E119 Type 2 diabetes mellitus without complications: Secondary | ICD-10-CM | POA: Diagnosis not present

## 2018-01-04 DIAGNOSIS — Z170 Estrogen receptor positive status [ER+]: Secondary | ICD-10-CM | POA: Diagnosis not present

## 2018-01-04 DIAGNOSIS — C50412 Malignant neoplasm of upper-outer quadrant of left female breast: Secondary | ICD-10-CM | POA: Diagnosis not present

## 2018-01-04 DIAGNOSIS — Z5111 Encounter for antineoplastic chemotherapy: Secondary | ICD-10-CM | POA: Diagnosis not present

## 2018-01-05 DIAGNOSIS — C773 Secondary and unspecified malignant neoplasm of axilla and upper limb lymph nodes: Secondary | ICD-10-CM | POA: Diagnosis not present

## 2018-01-05 DIAGNOSIS — Z170 Estrogen receptor positive status [ER+]: Secondary | ICD-10-CM | POA: Diagnosis not present

## 2018-01-05 DIAGNOSIS — E119 Type 2 diabetes mellitus without complications: Secondary | ICD-10-CM | POA: Diagnosis not present

## 2018-01-05 DIAGNOSIS — R112 Nausea with vomiting, unspecified: Secondary | ICD-10-CM | POA: Diagnosis not present

## 2018-01-05 DIAGNOSIS — C50412 Malignant neoplasm of upper-outer quadrant of left female breast: Secondary | ICD-10-CM | POA: Diagnosis not present

## 2018-01-05 DIAGNOSIS — I517 Cardiomegaly: Secondary | ICD-10-CM | POA: Diagnosis not present

## 2018-01-05 DIAGNOSIS — D0512 Intraductal carcinoma in situ of left breast: Secondary | ICD-10-CM | POA: Diagnosis not present

## 2018-01-05 DIAGNOSIS — Z5111 Encounter for antineoplastic chemotherapy: Secondary | ICD-10-CM | POA: Diagnosis not present

## 2018-01-05 DIAGNOSIS — I519 Heart disease, unspecified: Secondary | ICD-10-CM | POA: Diagnosis not present

## 2018-01-11 DIAGNOSIS — L0390 Cellulitis, unspecified: Secondary | ICD-10-CM | POA: Diagnosis not present

## 2018-01-12 DIAGNOSIS — Z170 Estrogen receptor positive status [ER+]: Secondary | ICD-10-CM | POA: Diagnosis not present

## 2018-01-12 DIAGNOSIS — E119 Type 2 diabetes mellitus without complications: Secondary | ICD-10-CM | POA: Diagnosis not present

## 2018-01-12 DIAGNOSIS — Z5111 Encounter for antineoplastic chemotherapy: Secondary | ICD-10-CM | POA: Diagnosis not present

## 2018-01-12 DIAGNOSIS — C50412 Malignant neoplasm of upper-outer quadrant of left female breast: Secondary | ICD-10-CM | POA: Diagnosis not present

## 2018-01-16 DIAGNOSIS — C50412 Malignant neoplasm of upper-outer quadrant of left female breast: Secondary | ICD-10-CM | POA: Diagnosis not present

## 2018-01-16 DIAGNOSIS — Z170 Estrogen receptor positive status [ER+]: Secondary | ICD-10-CM | POA: Diagnosis not present

## 2018-01-19 DIAGNOSIS — Z170 Estrogen receptor positive status [ER+]: Secondary | ICD-10-CM | POA: Diagnosis not present

## 2018-01-19 DIAGNOSIS — C50412 Malignant neoplasm of upper-outer quadrant of left female breast: Secondary | ICD-10-CM | POA: Diagnosis not present

## 2018-01-25 DIAGNOSIS — C50412 Malignant neoplasm of upper-outer quadrant of left female breast: Secondary | ICD-10-CM | POA: Diagnosis not present

## 2018-01-25 DIAGNOSIS — Z170 Estrogen receptor positive status [ER+]: Secondary | ICD-10-CM | POA: Diagnosis not present

## 2018-01-26 DIAGNOSIS — D0512 Intraductal carcinoma in situ of left breast: Secondary | ICD-10-CM | POA: Diagnosis not present

## 2018-01-26 DIAGNOSIS — D701 Agranulocytosis secondary to cancer chemotherapy: Secondary | ICD-10-CM | POA: Diagnosis not present

## 2018-01-26 DIAGNOSIS — C50412 Malignant neoplasm of upper-outer quadrant of left female breast: Secondary | ICD-10-CM | POA: Diagnosis not present

## 2018-01-26 DIAGNOSIS — D649 Anemia, unspecified: Secondary | ICD-10-CM | POA: Diagnosis not present

## 2018-01-26 DIAGNOSIS — K521 Toxic gastroenteritis and colitis: Secondary | ICD-10-CM | POA: Diagnosis not present

## 2018-01-26 DIAGNOSIS — Z5111 Encounter for antineoplastic chemotherapy: Secondary | ICD-10-CM | POA: Diagnosis not present

## 2018-01-26 DIAGNOSIS — Z170 Estrogen receptor positive status [ER+]: Secondary | ICD-10-CM | POA: Diagnosis not present

## 2018-01-26 DIAGNOSIS — D6959 Other secondary thrombocytopenia: Secondary | ICD-10-CM | POA: Diagnosis not present

## 2018-01-26 DIAGNOSIS — T451X5A Adverse effect of antineoplastic and immunosuppressive drugs, initial encounter: Secondary | ICD-10-CM | POA: Diagnosis not present

## 2018-01-26 DIAGNOSIS — Z6839 Body mass index (BMI) 39.0-39.9, adult: Secondary | ICD-10-CM | POA: Diagnosis not present

## 2018-01-26 DIAGNOSIS — C773 Secondary and unspecified malignant neoplasm of axilla and upper limb lymph nodes: Secondary | ICD-10-CM | POA: Diagnosis not present

## 2018-01-26 DIAGNOSIS — D72810 Lymphocytopenia: Secondary | ICD-10-CM | POA: Insufficient documentation

## 2018-02-02 DIAGNOSIS — Z5111 Encounter for antineoplastic chemotherapy: Secondary | ICD-10-CM | POA: Diagnosis not present

## 2018-02-02 DIAGNOSIS — Z6839 Body mass index (BMI) 39.0-39.9, adult: Secondary | ICD-10-CM | POA: Diagnosis not present

## 2018-02-02 DIAGNOSIS — K521 Toxic gastroenteritis and colitis: Secondary | ICD-10-CM | POA: Diagnosis not present

## 2018-02-02 DIAGNOSIS — T451X5A Adverse effect of antineoplastic and immunosuppressive drugs, initial encounter: Secondary | ICD-10-CM | POA: Diagnosis not present

## 2018-02-02 DIAGNOSIS — C50412 Malignant neoplasm of upper-outer quadrant of left female breast: Secondary | ICD-10-CM | POA: Diagnosis not present

## 2018-02-02 DIAGNOSIS — Z170 Estrogen receptor positive status [ER+]: Secondary | ICD-10-CM | POA: Diagnosis not present

## 2018-02-02 DIAGNOSIS — D701 Agranulocytosis secondary to cancer chemotherapy: Secondary | ICD-10-CM | POA: Diagnosis not present

## 2018-02-02 DIAGNOSIS — D6959 Other secondary thrombocytopenia: Secondary | ICD-10-CM | POA: Diagnosis not present

## 2018-02-02 DIAGNOSIS — D649 Anemia, unspecified: Secondary | ICD-10-CM | POA: Diagnosis not present

## 2018-02-09 DIAGNOSIS — Z170 Estrogen receptor positive status [ER+]: Secondary | ICD-10-CM | POA: Diagnosis not present

## 2018-02-09 DIAGNOSIS — C50412 Malignant neoplasm of upper-outer quadrant of left female breast: Secondary | ICD-10-CM | POA: Diagnosis not present

## 2018-02-15 DIAGNOSIS — Z5111 Encounter for antineoplastic chemotherapy: Secondary | ICD-10-CM | POA: Diagnosis not present

## 2018-02-15 DIAGNOSIS — D701 Agranulocytosis secondary to cancer chemotherapy: Secondary | ICD-10-CM | POA: Diagnosis not present

## 2018-02-15 DIAGNOSIS — Z170 Estrogen receptor positive status [ER+]: Secondary | ICD-10-CM | POA: Diagnosis not present

## 2018-02-15 DIAGNOSIS — K521 Toxic gastroenteritis and colitis: Secondary | ICD-10-CM | POA: Diagnosis not present

## 2018-02-15 DIAGNOSIS — T451X5A Adverse effect of antineoplastic and immunosuppressive drugs, initial encounter: Secondary | ICD-10-CM | POA: Diagnosis not present

## 2018-02-15 DIAGNOSIS — C50412 Malignant neoplasm of upper-outer quadrant of left female breast: Secondary | ICD-10-CM | POA: Diagnosis not present

## 2018-02-15 DIAGNOSIS — Z79899 Other long term (current) drug therapy: Secondary | ICD-10-CM | POA: Diagnosis not present

## 2018-02-15 DIAGNOSIS — D649 Anemia, unspecified: Secondary | ICD-10-CM | POA: Diagnosis not present

## 2018-02-16 DIAGNOSIS — Z5111 Encounter for antineoplastic chemotherapy: Secondary | ICD-10-CM | POA: Diagnosis not present

## 2018-02-16 DIAGNOSIS — K521 Toxic gastroenteritis and colitis: Secondary | ICD-10-CM | POA: Diagnosis not present

## 2018-02-16 DIAGNOSIS — Z79899 Other long term (current) drug therapy: Secondary | ICD-10-CM | POA: Diagnosis not present

## 2018-02-16 DIAGNOSIS — D6959 Other secondary thrombocytopenia: Secondary | ICD-10-CM | POA: Diagnosis not present

## 2018-02-16 DIAGNOSIS — Z1379 Encounter for other screening for genetic and chromosomal anomalies: Secondary | ICD-10-CM | POA: Diagnosis not present

## 2018-02-16 DIAGNOSIS — D701 Agranulocytosis secondary to cancer chemotherapy: Secondary | ICD-10-CM | POA: Diagnosis not present

## 2018-02-16 DIAGNOSIS — C50412 Malignant neoplasm of upper-outer quadrant of left female breast: Secondary | ICD-10-CM | POA: Diagnosis not present

## 2018-02-16 DIAGNOSIS — Z170 Estrogen receptor positive status [ER+]: Secondary | ICD-10-CM | POA: Diagnosis not present

## 2018-02-16 DIAGNOSIS — D649 Anemia, unspecified: Secondary | ICD-10-CM | POA: Diagnosis not present

## 2018-02-16 DIAGNOSIS — T451X5A Adverse effect of antineoplastic and immunosuppressive drugs, initial encounter: Secondary | ICD-10-CM | POA: Diagnosis not present

## 2018-02-20 DIAGNOSIS — Z79899 Other long term (current) drug therapy: Secondary | ICD-10-CM | POA: Diagnosis not present

## 2018-02-20 DIAGNOSIS — C50412 Malignant neoplasm of upper-outer quadrant of left female breast: Secondary | ICD-10-CM | POA: Diagnosis not present

## 2018-02-20 DIAGNOSIS — D649 Anemia, unspecified: Secondary | ICD-10-CM | POA: Diagnosis not present

## 2018-02-20 DIAGNOSIS — K521 Toxic gastroenteritis and colitis: Secondary | ICD-10-CM | POA: Diagnosis not present

## 2018-02-20 DIAGNOSIS — Z5111 Encounter for antineoplastic chemotherapy: Secondary | ICD-10-CM | POA: Diagnosis not present

## 2018-02-20 DIAGNOSIS — D701 Agranulocytosis secondary to cancer chemotherapy: Secondary | ICD-10-CM | POA: Diagnosis not present

## 2018-02-20 DIAGNOSIS — Z170 Estrogen receptor positive status [ER+]: Secondary | ICD-10-CM | POA: Diagnosis not present

## 2018-02-20 DIAGNOSIS — T451X5A Adverse effect of antineoplastic and immunosuppressive drugs, initial encounter: Secondary | ICD-10-CM | POA: Diagnosis not present

## 2018-02-26 DIAGNOSIS — R509 Fever, unspecified: Secondary | ICD-10-CM | POA: Diagnosis not present

## 2018-02-26 DIAGNOSIS — E039 Hypothyroidism, unspecified: Secondary | ICD-10-CM | POA: Diagnosis not present

## 2018-02-26 DIAGNOSIS — Z853 Personal history of malignant neoplasm of breast: Secondary | ICD-10-CM | POA: Diagnosis not present

## 2018-02-26 DIAGNOSIS — E860 Dehydration: Secondary | ICD-10-CM | POA: Diagnosis not present

## 2018-02-26 DIAGNOSIS — R7303 Prediabetes: Secondary | ICD-10-CM | POA: Diagnosis not present

## 2018-02-26 DIAGNOSIS — Z87891 Personal history of nicotine dependence: Secondary | ICD-10-CM | POA: Diagnosis not present

## 2018-02-26 DIAGNOSIS — R5081 Fever presenting with conditions classified elsewhere: Secondary | ICD-10-CM | POA: Diagnosis not present

## 2018-02-26 DIAGNOSIS — Z79899 Other long term (current) drug therapy: Secondary | ICD-10-CM | POA: Diagnosis not present

## 2018-02-26 DIAGNOSIS — D61818 Other pancytopenia: Secondary | ICD-10-CM | POA: Diagnosis not present

## 2018-02-26 DIAGNOSIS — Z886 Allergy status to analgesic agent status: Secondary | ICD-10-CM | POA: Diagnosis not present

## 2018-02-26 DIAGNOSIS — I10 Essential (primary) hypertension: Secondary | ICD-10-CM | POA: Diagnosis not present

## 2018-02-26 DIAGNOSIS — D539 Nutritional anemia, unspecified: Secondary | ICD-10-CM | POA: Diagnosis not present

## 2018-02-26 DIAGNOSIS — E876 Hypokalemia: Secondary | ICD-10-CM | POA: Diagnosis not present

## 2018-02-26 DIAGNOSIS — D709 Neutropenia, unspecified: Secondary | ICD-10-CM | POA: Diagnosis not present

## 2018-02-26 DIAGNOSIS — Z7984 Long term (current) use of oral hypoglycemic drugs: Secondary | ICD-10-CM | POA: Diagnosis not present

## 2018-02-27 DIAGNOSIS — E876 Hypokalemia: Secondary | ICD-10-CM | POA: Diagnosis not present

## 2018-02-27 DIAGNOSIS — I10 Essential (primary) hypertension: Secondary | ICD-10-CM | POA: Diagnosis not present

## 2018-02-27 DIAGNOSIS — N179 Acute kidney failure, unspecified: Secondary | ICD-10-CM | POA: Diagnosis not present

## 2018-02-27 DIAGNOSIS — D709 Neutropenia, unspecified: Secondary | ICD-10-CM | POA: Diagnosis not present

## 2018-02-28 DIAGNOSIS — N179 Acute kidney failure, unspecified: Secondary | ICD-10-CM | POA: Diagnosis not present

## 2018-02-28 DIAGNOSIS — D539 Nutritional anemia, unspecified: Secondary | ICD-10-CM | POA: Diagnosis not present

## 2018-02-28 DIAGNOSIS — I10 Essential (primary) hypertension: Secondary | ICD-10-CM | POA: Diagnosis not present

## 2018-02-28 DIAGNOSIS — E876 Hypokalemia: Secondary | ICD-10-CM | POA: Diagnosis not present

## 2018-02-28 DIAGNOSIS — D72819 Decreased white blood cell count, unspecified: Secondary | ICD-10-CM | POA: Diagnosis not present

## 2018-02-28 DIAGNOSIS — D696 Thrombocytopenia, unspecified: Secondary | ICD-10-CM | POA: Diagnosis not present

## 2018-02-28 DIAGNOSIS — D709 Neutropenia, unspecified: Secondary | ICD-10-CM | POA: Diagnosis not present

## 2018-03-01 DIAGNOSIS — D649 Anemia, unspecified: Secondary | ICD-10-CM | POA: Diagnosis not present

## 2018-03-01 DIAGNOSIS — Z09 Encounter for follow-up examination after completed treatment for conditions other than malignant neoplasm: Secondary | ICD-10-CM | POA: Diagnosis not present

## 2018-03-01 DIAGNOSIS — E876 Hypokalemia: Secondary | ICD-10-CM | POA: Diagnosis not present

## 2018-03-05 DIAGNOSIS — Z452 Encounter for adjustment and management of vascular access device: Secondary | ICD-10-CM | POA: Diagnosis not present

## 2018-03-05 DIAGNOSIS — D649 Anemia, unspecified: Secondary | ICD-10-CM | POA: Diagnosis not present

## 2018-03-05 DIAGNOSIS — Z09 Encounter for follow-up examination after completed treatment for conditions other than malignant neoplasm: Secondary | ICD-10-CM | POA: Diagnosis not present

## 2018-03-05 DIAGNOSIS — E876 Hypokalemia: Secondary | ICD-10-CM | POA: Diagnosis not present

## 2018-03-12 DIAGNOSIS — Z09 Encounter for follow-up examination after completed treatment for conditions other than malignant neoplasm: Secondary | ICD-10-CM | POA: Diagnosis not present

## 2018-03-12 DIAGNOSIS — E876 Hypokalemia: Secondary | ICD-10-CM | POA: Diagnosis not present

## 2018-03-12 DIAGNOSIS — D649 Anemia, unspecified: Secondary | ICD-10-CM | POA: Diagnosis not present

## 2018-03-14 DIAGNOSIS — Z833 Family history of diabetes mellitus: Secondary | ICD-10-CM | POA: Diagnosis not present

## 2018-03-14 DIAGNOSIS — K219 Gastro-esophageal reflux disease without esophagitis: Secondary | ICD-10-CM | POA: Diagnosis not present

## 2018-03-14 DIAGNOSIS — Z6841 Body Mass Index (BMI) 40.0 and over, adult: Secondary | ICD-10-CM | POA: Diagnosis not present

## 2018-03-14 DIAGNOSIS — Z8041 Family history of malignant neoplasm of ovary: Secondary | ICD-10-CM | POA: Diagnosis not present

## 2018-03-14 DIAGNOSIS — Z801 Family history of malignant neoplasm of trachea, bronchus and lung: Secondary | ICD-10-CM | POA: Diagnosis not present

## 2018-03-14 DIAGNOSIS — Z79899 Other long term (current) drug therapy: Secondary | ICD-10-CM | POA: Diagnosis not present

## 2018-03-14 DIAGNOSIS — R928 Other abnormal and inconclusive findings on diagnostic imaging of breast: Secondary | ICD-10-CM | POA: Diagnosis not present

## 2018-03-14 DIAGNOSIS — Z8249 Family history of ischemic heart disease and other diseases of the circulatory system: Secondary | ICD-10-CM | POA: Diagnosis not present

## 2018-03-14 DIAGNOSIS — I10 Essential (primary) hypertension: Secondary | ICD-10-CM | POA: Diagnosis not present

## 2018-03-14 DIAGNOSIS — Z800 Family history of malignant neoplasm of digestive organs: Secondary | ICD-10-CM | POA: Diagnosis not present

## 2018-03-14 DIAGNOSIS — Z886 Allergy status to analgesic agent status: Secondary | ICD-10-CM | POA: Diagnosis not present

## 2018-03-14 DIAGNOSIS — C50912 Malignant neoplasm of unspecified site of left female breast: Secondary | ICD-10-CM | POA: Diagnosis not present

## 2018-03-14 DIAGNOSIS — E785 Hyperlipidemia, unspecified: Secondary | ICD-10-CM | POA: Diagnosis not present

## 2018-03-14 DIAGNOSIS — Z87891 Personal history of nicotine dependence: Secondary | ICD-10-CM | POA: Diagnosis not present

## 2018-03-14 DIAGNOSIS — N6032 Fibrosclerosis of left breast: Secondary | ICD-10-CM | POA: Diagnosis not present

## 2018-03-14 DIAGNOSIS — E039 Hypothyroidism, unspecified: Secondary | ICD-10-CM | POA: Diagnosis not present

## 2018-03-14 DIAGNOSIS — Z170 Estrogen receptor positive status [ER+]: Secondary | ICD-10-CM | POA: Diagnosis not present

## 2018-03-14 DIAGNOSIS — C50412 Malignant neoplasm of upper-outer quadrant of left female breast: Secondary | ICD-10-CM | POA: Diagnosis not present

## 2018-03-27 DIAGNOSIS — Z170 Estrogen receptor positive status [ER+]: Secondary | ICD-10-CM | POA: Diagnosis not present

## 2018-03-27 DIAGNOSIS — T451X5A Adverse effect of antineoplastic and immunosuppressive drugs, initial encounter: Secondary | ICD-10-CM | POA: Diagnosis not present

## 2018-03-27 DIAGNOSIS — C50412 Malignant neoplasm of upper-outer quadrant of left female breast: Secondary | ICD-10-CM | POA: Diagnosis not present

## 2018-03-27 DIAGNOSIS — Z9889 Other specified postprocedural states: Secondary | ICD-10-CM | POA: Diagnosis not present

## 2018-03-28 DIAGNOSIS — Z5111 Encounter for antineoplastic chemotherapy: Secondary | ICD-10-CM | POA: Diagnosis not present

## 2018-03-28 DIAGNOSIS — Z79899 Other long term (current) drug therapy: Secondary | ICD-10-CM | POA: Diagnosis not present

## 2018-03-28 DIAGNOSIS — D6959 Other secondary thrombocytopenia: Secondary | ICD-10-CM | POA: Diagnosis not present

## 2018-03-28 DIAGNOSIS — D0512 Intraductal carcinoma in situ of left breast: Secondary | ICD-10-CM | POA: Diagnosis not present

## 2018-03-28 DIAGNOSIS — C50412 Malignant neoplasm of upper-outer quadrant of left female breast: Secondary | ICD-10-CM | POA: Diagnosis not present

## 2018-03-28 DIAGNOSIS — Z5181 Encounter for therapeutic drug level monitoring: Secondary | ICD-10-CM | POA: Diagnosis not present

## 2018-03-28 DIAGNOSIS — Z170 Estrogen receptor positive status [ER+]: Secondary | ICD-10-CM | POA: Diagnosis not present

## 2018-03-28 DIAGNOSIS — C773 Secondary and unspecified malignant neoplasm of axilla and upper limb lymph nodes: Secondary | ICD-10-CM | POA: Diagnosis not present

## 2018-03-28 DIAGNOSIS — T451X5A Adverse effect of antineoplastic and immunosuppressive drugs, initial encounter: Secondary | ICD-10-CM | POA: Diagnosis not present

## 2018-03-30 DIAGNOSIS — Z5111 Encounter for antineoplastic chemotherapy: Secondary | ICD-10-CM | POA: Diagnosis not present

## 2018-03-30 DIAGNOSIS — D6959 Other secondary thrombocytopenia: Secondary | ICD-10-CM | POA: Diagnosis not present

## 2018-03-30 DIAGNOSIS — T451X5A Adverse effect of antineoplastic and immunosuppressive drugs, initial encounter: Secondary | ICD-10-CM | POA: Diagnosis not present

## 2018-03-30 DIAGNOSIS — Z5181 Encounter for therapeutic drug level monitoring: Secondary | ICD-10-CM | POA: Diagnosis not present

## 2018-03-30 DIAGNOSIS — C50412 Malignant neoplasm of upper-outer quadrant of left female breast: Secondary | ICD-10-CM | POA: Diagnosis not present

## 2018-03-30 DIAGNOSIS — Z170 Estrogen receptor positive status [ER+]: Secondary | ICD-10-CM | POA: Diagnosis not present

## 2018-03-30 DIAGNOSIS — Z79899 Other long term (current) drug therapy: Secondary | ICD-10-CM | POA: Diagnosis not present

## 2018-04-02 DIAGNOSIS — Z170 Estrogen receptor positive status [ER+]: Secondary | ICD-10-CM | POA: Diagnosis not present

## 2018-04-02 DIAGNOSIS — Z510 Encounter for antineoplastic radiation therapy: Secondary | ICD-10-CM | POA: Diagnosis not present

## 2018-04-02 DIAGNOSIS — C50412 Malignant neoplasm of upper-outer quadrant of left female breast: Secondary | ICD-10-CM | POA: Diagnosis not present

## 2018-04-03 DIAGNOSIS — I340 Nonrheumatic mitral (valve) insufficiency: Secondary | ICD-10-CM | POA: Diagnosis not present

## 2018-04-03 DIAGNOSIS — I371 Nonrheumatic pulmonary valve insufficiency: Secondary | ICD-10-CM | POA: Diagnosis not present

## 2018-04-03 DIAGNOSIS — I519 Heart disease, unspecified: Secondary | ICD-10-CM | POA: Diagnosis not present

## 2018-04-03 DIAGNOSIS — I059 Rheumatic mitral valve disease, unspecified: Secondary | ICD-10-CM | POA: Diagnosis not present

## 2018-04-03 DIAGNOSIS — I517 Cardiomegaly: Secondary | ICD-10-CM | POA: Diagnosis not present

## 2018-04-03 DIAGNOSIS — C50412 Malignant neoplasm of upper-outer quadrant of left female breast: Secondary | ICD-10-CM | POA: Diagnosis not present

## 2018-04-03 DIAGNOSIS — I359 Nonrheumatic aortic valve disorder, unspecified: Secondary | ICD-10-CM | POA: Diagnosis not present

## 2018-04-03 DIAGNOSIS — Z170 Estrogen receptor positive status [ER+]: Secondary | ICD-10-CM | POA: Diagnosis not present

## 2018-04-13 DIAGNOSIS — C50412 Malignant neoplasm of upper-outer quadrant of left female breast: Secondary | ICD-10-CM | POA: Diagnosis not present

## 2018-04-13 DIAGNOSIS — Z510 Encounter for antineoplastic radiation therapy: Secondary | ICD-10-CM | POA: Diagnosis not present

## 2018-04-16 DIAGNOSIS — Z510 Encounter for antineoplastic radiation therapy: Secondary | ICD-10-CM | POA: Diagnosis not present

## 2018-04-16 DIAGNOSIS — C50412 Malignant neoplasm of upper-outer quadrant of left female breast: Secondary | ICD-10-CM | POA: Diagnosis not present

## 2018-04-17 DIAGNOSIS — Z510 Encounter for antineoplastic radiation therapy: Secondary | ICD-10-CM | POA: Diagnosis not present

## 2018-04-17 DIAGNOSIS — C50412 Malignant neoplasm of upper-outer quadrant of left female breast: Secondary | ICD-10-CM | POA: Diagnosis not present

## 2018-04-18 DIAGNOSIS — Z510 Encounter for antineoplastic radiation therapy: Secondary | ICD-10-CM | POA: Diagnosis not present

## 2018-04-18 DIAGNOSIS — C50412 Malignant neoplasm of upper-outer quadrant of left female breast: Secondary | ICD-10-CM | POA: Diagnosis not present

## 2018-04-19 DIAGNOSIS — Z510 Encounter for antineoplastic radiation therapy: Secondary | ICD-10-CM | POA: Diagnosis not present

## 2018-04-19 DIAGNOSIS — C50412 Malignant neoplasm of upper-outer quadrant of left female breast: Secondary | ICD-10-CM | POA: Diagnosis not present

## 2018-04-20 DIAGNOSIS — C773 Secondary and unspecified malignant neoplasm of axilla and upper limb lymph nodes: Secondary | ICD-10-CM | POA: Diagnosis not present

## 2018-04-20 DIAGNOSIS — Z170 Estrogen receptor positive status [ER+]: Secondary | ICD-10-CM | POA: Diagnosis not present

## 2018-04-20 DIAGNOSIS — Z79811 Long term (current) use of aromatase inhibitors: Secondary | ICD-10-CM | POA: Diagnosis not present

## 2018-04-20 DIAGNOSIS — D649 Anemia, unspecified: Secondary | ICD-10-CM | POA: Diagnosis not present

## 2018-04-20 DIAGNOSIS — D696 Thrombocytopenia, unspecified: Secondary | ICD-10-CM | POA: Diagnosis not present

## 2018-04-20 DIAGNOSIS — C50412 Malignant neoplasm of upper-outer quadrant of left female breast: Secondary | ICD-10-CM | POA: Diagnosis not present

## 2018-04-20 DIAGNOSIS — Z5111 Encounter for antineoplastic chemotherapy: Secondary | ICD-10-CM | POA: Diagnosis not present

## 2018-04-20 DIAGNOSIS — Z510 Encounter for antineoplastic radiation therapy: Secondary | ICD-10-CM | POA: Diagnosis not present

## 2018-04-23 DIAGNOSIS — C50412 Malignant neoplasm of upper-outer quadrant of left female breast: Secondary | ICD-10-CM | POA: Diagnosis not present

## 2018-04-23 DIAGNOSIS — Z510 Encounter for antineoplastic radiation therapy: Secondary | ICD-10-CM | POA: Diagnosis not present

## 2018-04-24 DIAGNOSIS — C50412 Malignant neoplasm of upper-outer quadrant of left female breast: Secondary | ICD-10-CM | POA: Diagnosis not present

## 2018-04-24 DIAGNOSIS — Z510 Encounter for antineoplastic radiation therapy: Secondary | ICD-10-CM | POA: Diagnosis not present

## 2018-04-25 DIAGNOSIS — Z510 Encounter for antineoplastic radiation therapy: Secondary | ICD-10-CM | POA: Diagnosis not present

## 2018-04-25 DIAGNOSIS — C50412 Malignant neoplasm of upper-outer quadrant of left female breast: Secondary | ICD-10-CM | POA: Diagnosis not present

## 2018-04-26 DIAGNOSIS — C50412 Malignant neoplasm of upper-outer quadrant of left female breast: Secondary | ICD-10-CM | POA: Diagnosis not present

## 2018-04-26 DIAGNOSIS — Z510 Encounter for antineoplastic radiation therapy: Secondary | ICD-10-CM | POA: Diagnosis not present

## 2018-04-27 DIAGNOSIS — Z510 Encounter for antineoplastic radiation therapy: Secondary | ICD-10-CM | POA: Diagnosis not present

## 2018-04-27 DIAGNOSIS — C50412 Malignant neoplasm of upper-outer quadrant of left female breast: Secondary | ICD-10-CM | POA: Diagnosis not present

## 2018-04-30 DIAGNOSIS — Z510 Encounter for antineoplastic radiation therapy: Secondary | ICD-10-CM | POA: Diagnosis not present

## 2018-04-30 DIAGNOSIS — C50412 Malignant neoplasm of upper-outer quadrant of left female breast: Secondary | ICD-10-CM | POA: Diagnosis not present

## 2018-05-01 DIAGNOSIS — Z510 Encounter for antineoplastic radiation therapy: Secondary | ICD-10-CM | POA: Diagnosis not present

## 2018-05-01 DIAGNOSIS — C50412 Malignant neoplasm of upper-outer quadrant of left female breast: Secondary | ICD-10-CM | POA: Diagnosis not present

## 2018-05-02 DIAGNOSIS — C50412 Malignant neoplasm of upper-outer quadrant of left female breast: Secondary | ICD-10-CM | POA: Diagnosis not present

## 2018-05-02 DIAGNOSIS — Z510 Encounter for antineoplastic radiation therapy: Secondary | ICD-10-CM | POA: Diagnosis not present

## 2018-05-03 DIAGNOSIS — C50412 Malignant neoplasm of upper-outer quadrant of left female breast: Secondary | ICD-10-CM | POA: Diagnosis not present

## 2018-05-03 DIAGNOSIS — Z510 Encounter for antineoplastic radiation therapy: Secondary | ICD-10-CM | POA: Diagnosis not present

## 2018-05-04 DIAGNOSIS — Z79811 Long term (current) use of aromatase inhibitors: Secondary | ICD-10-CM | POA: Diagnosis not present

## 2018-05-04 DIAGNOSIS — Z170 Estrogen receptor positive status [ER+]: Secondary | ICD-10-CM | POA: Diagnosis not present

## 2018-05-04 DIAGNOSIS — C50412 Malignant neoplasm of upper-outer quadrant of left female breast: Secondary | ICD-10-CM | POA: Diagnosis not present

## 2018-05-04 DIAGNOSIS — Z510 Encounter for antineoplastic radiation therapy: Secondary | ICD-10-CM | POA: Diagnosis not present

## 2018-05-08 DIAGNOSIS — Z510 Encounter for antineoplastic radiation therapy: Secondary | ICD-10-CM | POA: Diagnosis not present

## 2018-05-08 DIAGNOSIS — Z170 Estrogen receptor positive status [ER+]: Secondary | ICD-10-CM | POA: Diagnosis not present

## 2018-05-08 DIAGNOSIS — C50412 Malignant neoplasm of upper-outer quadrant of left female breast: Secondary | ICD-10-CM | POA: Diagnosis not present

## 2018-05-09 DIAGNOSIS — Z510 Encounter for antineoplastic radiation therapy: Secondary | ICD-10-CM | POA: Diagnosis not present

## 2018-05-09 DIAGNOSIS — C50412 Malignant neoplasm of upper-outer quadrant of left female breast: Secondary | ICD-10-CM | POA: Diagnosis not present

## 2018-05-09 DIAGNOSIS — Z170 Estrogen receptor positive status [ER+]: Secondary | ICD-10-CM | POA: Diagnosis not present

## 2018-05-10 DIAGNOSIS — Z510 Encounter for antineoplastic radiation therapy: Secondary | ICD-10-CM | POA: Diagnosis not present

## 2018-05-10 DIAGNOSIS — C50412 Malignant neoplasm of upper-outer quadrant of left female breast: Secondary | ICD-10-CM | POA: Diagnosis not present

## 2018-05-10 DIAGNOSIS — Z170 Estrogen receptor positive status [ER+]: Secondary | ICD-10-CM | POA: Diagnosis not present

## 2018-05-11 DIAGNOSIS — Z510 Encounter for antineoplastic radiation therapy: Secondary | ICD-10-CM | POA: Diagnosis not present

## 2018-05-11 DIAGNOSIS — C50412 Malignant neoplasm of upper-outer quadrant of left female breast: Secondary | ICD-10-CM | POA: Diagnosis not present

## 2018-05-11 DIAGNOSIS — R42 Dizziness and giddiness: Secondary | ICD-10-CM | POA: Diagnosis not present

## 2018-05-11 DIAGNOSIS — Z5111 Encounter for antineoplastic chemotherapy: Secondary | ICD-10-CM | POA: Diagnosis not present

## 2018-05-11 DIAGNOSIS — Z79811 Long term (current) use of aromatase inhibitors: Secondary | ICD-10-CM | POA: Diagnosis not present

## 2018-05-11 DIAGNOSIS — Z170 Estrogen receptor positive status [ER+]: Secondary | ICD-10-CM | POA: Diagnosis not present

## 2018-05-14 DIAGNOSIS — Z510 Encounter for antineoplastic radiation therapy: Secondary | ICD-10-CM | POA: Diagnosis not present

## 2018-05-14 DIAGNOSIS — C50412 Malignant neoplasm of upper-outer quadrant of left female breast: Secondary | ICD-10-CM | POA: Diagnosis not present

## 2018-05-14 DIAGNOSIS — Z170 Estrogen receptor positive status [ER+]: Secondary | ICD-10-CM | POA: Diagnosis not present

## 2018-05-15 DIAGNOSIS — C50412 Malignant neoplasm of upper-outer quadrant of left female breast: Secondary | ICD-10-CM | POA: Diagnosis not present

## 2018-05-15 DIAGNOSIS — Z510 Encounter for antineoplastic radiation therapy: Secondary | ICD-10-CM | POA: Diagnosis not present

## 2018-05-15 DIAGNOSIS — Z170 Estrogen receptor positive status [ER+]: Secondary | ICD-10-CM | POA: Diagnosis not present

## 2018-05-16 DIAGNOSIS — C50412 Malignant neoplasm of upper-outer quadrant of left female breast: Secondary | ICD-10-CM | POA: Diagnosis not present

## 2018-05-16 DIAGNOSIS — Z170 Estrogen receptor positive status [ER+]: Secondary | ICD-10-CM | POA: Diagnosis not present

## 2018-05-16 DIAGNOSIS — Z510 Encounter for antineoplastic radiation therapy: Secondary | ICD-10-CM | POA: Diagnosis not present

## 2018-05-21 DIAGNOSIS — Z510 Encounter for antineoplastic radiation therapy: Secondary | ICD-10-CM | POA: Diagnosis not present

## 2018-05-21 DIAGNOSIS — C50412 Malignant neoplasm of upper-outer quadrant of left female breast: Secondary | ICD-10-CM | POA: Diagnosis not present

## 2018-05-21 DIAGNOSIS — Z170 Estrogen receptor positive status [ER+]: Secondary | ICD-10-CM | POA: Diagnosis not present

## 2018-05-22 DIAGNOSIS — C50412 Malignant neoplasm of upper-outer quadrant of left female breast: Secondary | ICD-10-CM | POA: Diagnosis not present

## 2018-05-22 DIAGNOSIS — Z170 Estrogen receptor positive status [ER+]: Secondary | ICD-10-CM | POA: Diagnosis not present

## 2018-05-22 DIAGNOSIS — Z510 Encounter for antineoplastic radiation therapy: Secondary | ICD-10-CM | POA: Diagnosis not present

## 2018-05-23 DIAGNOSIS — Z170 Estrogen receptor positive status [ER+]: Secondary | ICD-10-CM | POA: Diagnosis not present

## 2018-05-23 DIAGNOSIS — C50412 Malignant neoplasm of upper-outer quadrant of left female breast: Secondary | ICD-10-CM | POA: Diagnosis not present

## 2018-05-23 DIAGNOSIS — Z510 Encounter for antineoplastic radiation therapy: Secondary | ICD-10-CM | POA: Diagnosis not present

## 2018-05-24 DIAGNOSIS — Z510 Encounter for antineoplastic radiation therapy: Secondary | ICD-10-CM | POA: Diagnosis not present

## 2018-05-24 DIAGNOSIS — C50412 Malignant neoplasm of upper-outer quadrant of left female breast: Secondary | ICD-10-CM | POA: Diagnosis not present

## 2018-05-24 DIAGNOSIS — Z170 Estrogen receptor positive status [ER+]: Secondary | ICD-10-CM | POA: Diagnosis not present

## 2018-06-01 DIAGNOSIS — Z79811 Long term (current) use of aromatase inhibitors: Secondary | ICD-10-CM | POA: Diagnosis not present

## 2018-06-01 DIAGNOSIS — Z5111 Encounter for antineoplastic chemotherapy: Secondary | ICD-10-CM | POA: Diagnosis not present

## 2018-06-01 DIAGNOSIS — Z170 Estrogen receptor positive status [ER+]: Secondary | ICD-10-CM | POA: Diagnosis not present

## 2018-06-01 DIAGNOSIS — R42 Dizziness and giddiness: Secondary | ICD-10-CM | POA: Diagnosis not present

## 2018-06-01 DIAGNOSIS — C50412 Malignant neoplasm of upper-outer quadrant of left female breast: Secondary | ICD-10-CM | POA: Diagnosis not present

## 2018-06-22 DIAGNOSIS — Z5112 Encounter for antineoplastic immunotherapy: Secondary | ICD-10-CM | POA: Diagnosis not present

## 2018-06-22 DIAGNOSIS — Z170 Estrogen receptor positive status [ER+]: Secondary | ICD-10-CM | POA: Diagnosis not present

## 2018-06-22 DIAGNOSIS — C50412 Malignant neoplasm of upper-outer quadrant of left female breast: Secondary | ICD-10-CM | POA: Diagnosis not present

## 2018-07-11 DIAGNOSIS — Z170 Estrogen receptor positive status [ER+]: Secondary | ICD-10-CM | POA: Diagnosis not present

## 2018-07-11 DIAGNOSIS — C50412 Malignant neoplasm of upper-outer quadrant of left female breast: Secondary | ICD-10-CM | POA: Diagnosis not present

## 2018-07-11 DIAGNOSIS — I519 Heart disease, unspecified: Secondary | ICD-10-CM | POA: Diagnosis not present

## 2018-07-11 DIAGNOSIS — Z79899 Other long term (current) drug therapy: Secondary | ICD-10-CM | POA: Diagnosis not present

## 2018-07-11 DIAGNOSIS — I080 Rheumatic disorders of both mitral and aortic valves: Secondary | ICD-10-CM | POA: Diagnosis not present

## 2018-07-13 DIAGNOSIS — Z170 Estrogen receptor positive status [ER+]: Secondary | ICD-10-CM | POA: Diagnosis not present

## 2018-07-13 DIAGNOSIS — Z5111 Encounter for antineoplastic chemotherapy: Secondary | ICD-10-CM | POA: Diagnosis not present

## 2018-07-13 DIAGNOSIS — Z1379 Encounter for other screening for genetic and chromosomal anomalies: Secondary | ICD-10-CM | POA: Diagnosis not present

## 2018-07-13 DIAGNOSIS — D6959 Other secondary thrombocytopenia: Secondary | ICD-10-CM | POA: Diagnosis not present

## 2018-07-13 DIAGNOSIS — C50412 Malignant neoplasm of upper-outer quadrant of left female breast: Secondary | ICD-10-CM | POA: Diagnosis not present

## 2018-07-13 DIAGNOSIS — Z1501 Genetic susceptibility to malignant neoplasm of breast: Secondary | ICD-10-CM | POA: Diagnosis not present

## 2018-08-06 DIAGNOSIS — R42 Dizziness and giddiness: Secondary | ICD-10-CM | POA: Diagnosis not present

## 2018-08-06 DIAGNOSIS — Z170 Estrogen receptor positive status [ER+]: Secondary | ICD-10-CM | POA: Diagnosis not present

## 2018-08-06 DIAGNOSIS — Z5112 Encounter for antineoplastic immunotherapy: Secondary | ICD-10-CM | POA: Diagnosis not present

## 2018-08-06 DIAGNOSIS — D6959 Other secondary thrombocytopenia: Secondary | ICD-10-CM | POA: Diagnosis not present

## 2018-08-06 DIAGNOSIS — C50412 Malignant neoplasm of upper-outer quadrant of left female breast: Secondary | ICD-10-CM | POA: Diagnosis not present

## 2018-08-06 DIAGNOSIS — D649 Anemia, unspecified: Secondary | ICD-10-CM | POA: Diagnosis not present

## 2018-08-06 DIAGNOSIS — I10 Essential (primary) hypertension: Secondary | ICD-10-CM | POA: Diagnosis not present

## 2018-08-06 DIAGNOSIS — Z6838 Body mass index (BMI) 38.0-38.9, adult: Secondary | ICD-10-CM | POA: Diagnosis not present

## 2018-08-06 DIAGNOSIS — T451X5A Adverse effect of antineoplastic and immunosuppressive drugs, initial encounter: Secondary | ICD-10-CM | POA: Diagnosis not present

## 2018-08-06 DIAGNOSIS — D72810 Lymphocytopenia: Secondary | ICD-10-CM | POA: Diagnosis not present

## 2018-08-06 DIAGNOSIS — Z1379 Encounter for other screening for genetic and chromosomal anomalies: Secondary | ICD-10-CM | POA: Diagnosis not present

## 2018-08-24 DIAGNOSIS — Z5112 Encounter for antineoplastic immunotherapy: Secondary | ICD-10-CM | POA: Diagnosis not present

## 2018-08-24 DIAGNOSIS — T451X5A Adverse effect of antineoplastic and immunosuppressive drugs, initial encounter: Secondary | ICD-10-CM | POA: Diagnosis not present

## 2018-08-24 DIAGNOSIS — D72810 Lymphocytopenia: Secondary | ICD-10-CM | POA: Diagnosis not present

## 2018-08-24 DIAGNOSIS — R42 Dizziness and giddiness: Secondary | ICD-10-CM | POA: Diagnosis not present

## 2018-08-24 DIAGNOSIS — I10 Essential (primary) hypertension: Secondary | ICD-10-CM | POA: Diagnosis not present

## 2018-08-24 DIAGNOSIS — D6959 Other secondary thrombocytopenia: Secondary | ICD-10-CM | POA: Diagnosis not present

## 2018-08-24 DIAGNOSIS — D649 Anemia, unspecified: Secondary | ICD-10-CM | POA: Diagnosis not present

## 2018-08-24 DIAGNOSIS — Z170 Estrogen receptor positive status [ER+]: Secondary | ICD-10-CM | POA: Diagnosis not present

## 2018-08-24 DIAGNOSIS — C50412 Malignant neoplasm of upper-outer quadrant of left female breast: Secondary | ICD-10-CM | POA: Diagnosis not present

## 2018-08-24 DIAGNOSIS — Z6838 Body mass index (BMI) 38.0-38.9, adult: Secondary | ICD-10-CM | POA: Diagnosis not present

## 2018-08-24 DIAGNOSIS — Z1379 Encounter for other screening for genetic and chromosomal anomalies: Secondary | ICD-10-CM | POA: Diagnosis not present

## 2018-09-04 DIAGNOSIS — R930 Abnormal findings on diagnostic imaging of skull and head, not elsewhere classified: Secondary | ICD-10-CM | POA: Diagnosis not present

## 2018-09-13 DIAGNOSIS — R42 Dizziness and giddiness: Secondary | ICD-10-CM | POA: Diagnosis not present

## 2018-09-13 DIAGNOSIS — C50412 Malignant neoplasm of upper-outer quadrant of left female breast: Secondary | ICD-10-CM | POA: Diagnosis not present

## 2018-09-13 DIAGNOSIS — R51 Headache: Secondary | ICD-10-CM | POA: Diagnosis not present

## 2018-09-13 DIAGNOSIS — R930 Abnormal findings on diagnostic imaging of skull and head, not elsewhere classified: Secondary | ICD-10-CM | POA: Diagnosis not present

## 2018-09-14 DIAGNOSIS — Z5111 Encounter for antineoplastic chemotherapy: Secondary | ICD-10-CM | POA: Diagnosis not present

## 2018-09-14 DIAGNOSIS — Z170 Estrogen receptor positive status [ER+]: Secondary | ICD-10-CM | POA: Diagnosis not present

## 2018-09-14 DIAGNOSIS — C50412 Malignant neoplasm of upper-outer quadrant of left female breast: Secondary | ICD-10-CM | POA: Diagnosis not present

## 2018-10-05 DIAGNOSIS — Z170 Estrogen receptor positive status [ER+]: Secondary | ICD-10-CM | POA: Diagnosis not present

## 2018-10-05 DIAGNOSIS — C50412 Malignant neoplasm of upper-outer quadrant of left female breast: Secondary | ICD-10-CM | POA: Diagnosis not present

## 2018-10-05 DIAGNOSIS — Z5111 Encounter for antineoplastic chemotherapy: Secondary | ICD-10-CM | POA: Diagnosis not present

## 2018-10-09 DIAGNOSIS — I071 Rheumatic tricuspid insufficiency: Secondary | ICD-10-CM | POA: Diagnosis not present

## 2018-10-09 DIAGNOSIS — Z170 Estrogen receptor positive status [ER+]: Secondary | ICD-10-CM | POA: Diagnosis not present

## 2018-10-09 DIAGNOSIS — C50412 Malignant neoplasm of upper-outer quadrant of left female breast: Secondary | ICD-10-CM | POA: Diagnosis not present

## 2018-10-18 DIAGNOSIS — C50412 Malignant neoplasm of upper-outer quadrant of left female breast: Secondary | ICD-10-CM | POA: Diagnosis not present

## 2018-10-18 DIAGNOSIS — Z5111 Encounter for antineoplastic chemotherapy: Secondary | ICD-10-CM | POA: Diagnosis not present

## 2018-10-18 DIAGNOSIS — R921 Mammographic calcification found on diagnostic imaging of breast: Secondary | ICD-10-CM | POA: Diagnosis not present

## 2018-10-18 DIAGNOSIS — Z170 Estrogen receptor positive status [ER+]: Secondary | ICD-10-CM | POA: Diagnosis not present

## 2018-10-26 DIAGNOSIS — T451X5A Adverse effect of antineoplastic and immunosuppressive drugs, initial encounter: Secondary | ICD-10-CM | POA: Diagnosis not present

## 2018-10-26 DIAGNOSIS — R42 Dizziness and giddiness: Secondary | ICD-10-CM | POA: Diagnosis not present

## 2018-10-26 DIAGNOSIS — I10 Essential (primary) hypertension: Secondary | ICD-10-CM | POA: Diagnosis not present

## 2018-10-26 DIAGNOSIS — D6959 Other secondary thrombocytopenia: Secondary | ICD-10-CM | POA: Diagnosis not present

## 2018-10-26 DIAGNOSIS — Z5111 Encounter for antineoplastic chemotherapy: Secondary | ICD-10-CM | POA: Diagnosis not present

## 2018-10-26 DIAGNOSIS — E118 Type 2 diabetes mellitus with unspecified complications: Secondary | ICD-10-CM | POA: Diagnosis not present

## 2018-10-26 DIAGNOSIS — D649 Anemia, unspecified: Secondary | ICD-10-CM | POA: Diagnosis not present

## 2018-10-26 DIAGNOSIS — Z6841 Body Mass Index (BMI) 40.0 and over, adult: Secondary | ICD-10-CM | POA: Diagnosis not present

## 2018-10-26 DIAGNOSIS — C50412 Malignant neoplasm of upper-outer quadrant of left female breast: Secondary | ICD-10-CM | POA: Diagnosis not present

## 2018-10-26 DIAGNOSIS — Z170 Estrogen receptor positive status [ER+]: Secondary | ICD-10-CM | POA: Diagnosis not present

## 2018-11-08 DIAGNOSIS — Z0000 Encounter for general adult medical examination without abnormal findings: Secondary | ICD-10-CM | POA: Diagnosis not present

## 2018-11-08 DIAGNOSIS — E782 Mixed hyperlipidemia: Secondary | ICD-10-CM | POA: Diagnosis not present

## 2018-11-08 DIAGNOSIS — I10 Essential (primary) hypertension: Secondary | ICD-10-CM | POA: Diagnosis not present

## 2018-11-08 DIAGNOSIS — E063 Autoimmune thyroiditis: Secondary | ICD-10-CM | POA: Diagnosis not present

## 2018-11-08 DIAGNOSIS — E1165 Type 2 diabetes mellitus with hyperglycemia: Secondary | ICD-10-CM | POA: Diagnosis not present

## 2018-11-15 DIAGNOSIS — E1129 Type 2 diabetes mellitus with other diabetic kidney complication: Secondary | ICD-10-CM | POA: Diagnosis not present

## 2018-11-15 DIAGNOSIS — Z0000 Encounter for general adult medical examination without abnormal findings: Secondary | ICD-10-CM | POA: Diagnosis not present

## 2018-11-15 DIAGNOSIS — E119 Type 2 diabetes mellitus without complications: Secondary | ICD-10-CM | POA: Diagnosis not present

## 2018-11-15 DIAGNOSIS — E1165 Type 2 diabetes mellitus with hyperglycemia: Secondary | ICD-10-CM | POA: Diagnosis not present

## 2018-12-06 DIAGNOSIS — Z5181 Encounter for therapeutic drug level monitoring: Secondary | ICD-10-CM | POA: Diagnosis not present

## 2018-12-06 DIAGNOSIS — Z1379 Encounter for other screening for genetic and chromosomal anomalies: Secondary | ICD-10-CM | POA: Diagnosis not present

## 2018-12-06 DIAGNOSIS — E039 Hypothyroidism, unspecified: Secondary | ICD-10-CM | POA: Diagnosis not present

## 2018-12-06 DIAGNOSIS — Z79811 Long term (current) use of aromatase inhibitors: Secondary | ICD-10-CM | POA: Insufficient documentation

## 2018-12-06 DIAGNOSIS — C50412 Malignant neoplasm of upper-outer quadrant of left female breast: Secondary | ICD-10-CM | POA: Diagnosis not present

## 2018-12-06 DIAGNOSIS — Z6839 Body mass index (BMI) 39.0-39.9, adult: Secondary | ICD-10-CM | POA: Diagnosis not present

## 2018-12-06 DIAGNOSIS — Z170 Estrogen receptor positive status [ER+]: Secondary | ICD-10-CM | POA: Diagnosis not present

## 2018-12-06 DIAGNOSIS — Z87898 Personal history of other specified conditions: Secondary | ICD-10-CM | POA: Diagnosis not present

## 2018-12-06 DIAGNOSIS — I10 Essential (primary) hypertension: Secondary | ICD-10-CM | POA: Diagnosis not present

## 2018-12-19 DIAGNOSIS — Z170 Estrogen receptor positive status [ER+]: Secondary | ICD-10-CM | POA: Diagnosis not present

## 2018-12-19 DIAGNOSIS — C50412 Malignant neoplasm of upper-outer quadrant of left female breast: Secondary | ICD-10-CM | POA: Diagnosis not present

## 2018-12-23 IMAGING — CT CT ABD-PELV W/O CM
3 of 4 series · 13 of 36 positions shown, 19 images · non-contrast
Comparison: Ultrasound on 01/09/2015

CLINICAL DATA: Right upper quadrant pain for 3 weeks.  Diarrhea.

EXAM:
CT ABDOMEN AND PELVIS WITHOUT CONTRAST
TECHNIQUE: Multidetector CT imaging of the abdomen and pelvis was performed
following the standard protocol without IV contrast.

[Series 3: abd/pelvis w/o · axial · non-contrast · 0.89mm/px · z∈[-399,-24]mm · 8 of 97 slices shown, 13 images]
[im 11/97  soft-tissue]
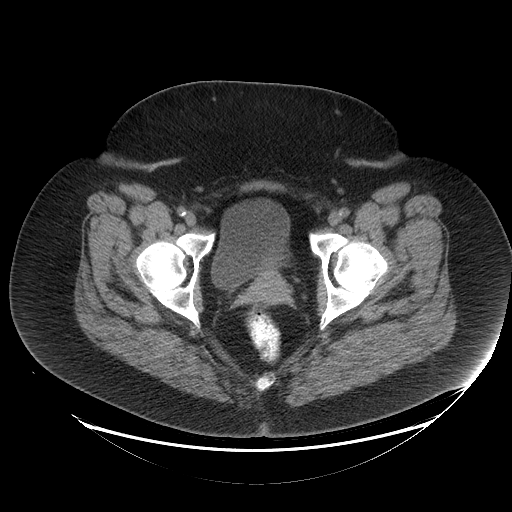
[im 11/97  bone]
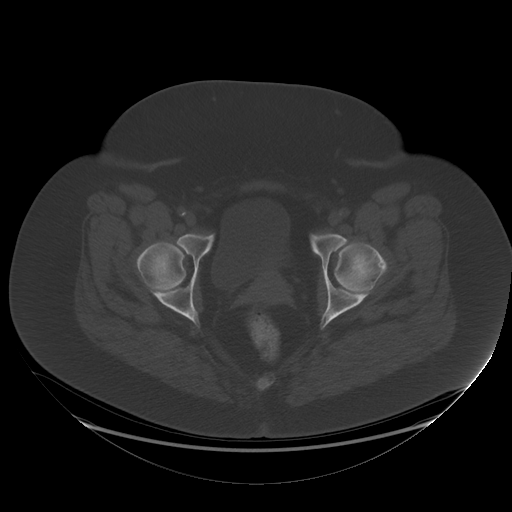
[im 22/97  soft-tissue]
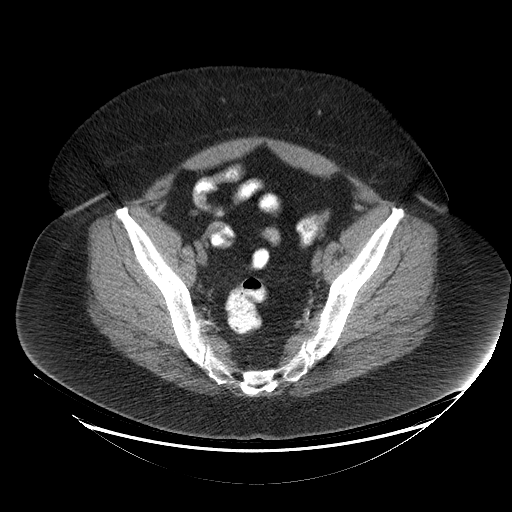
[im 33/97  soft-tissue]
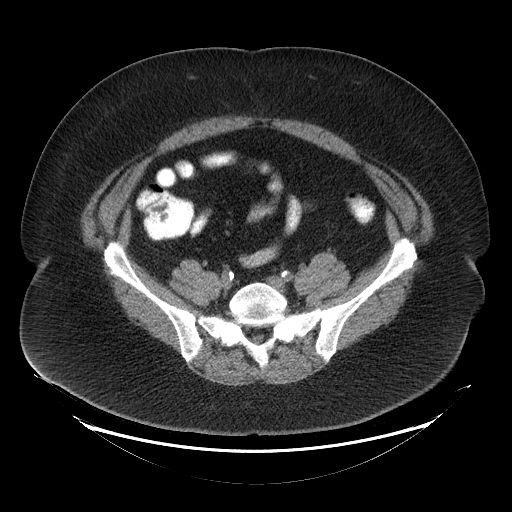
[im 43/97  soft-tissue]
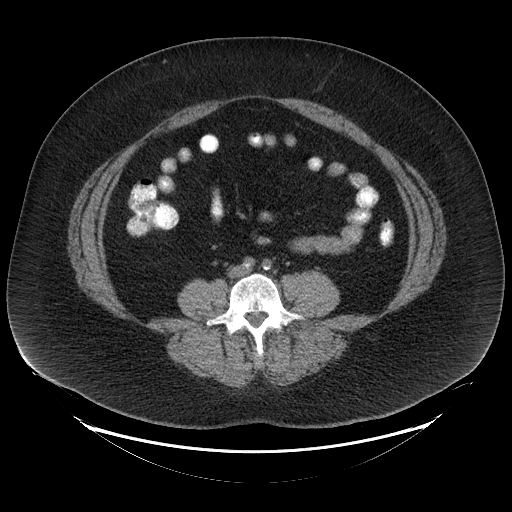
[im 54/97  soft-tissue]
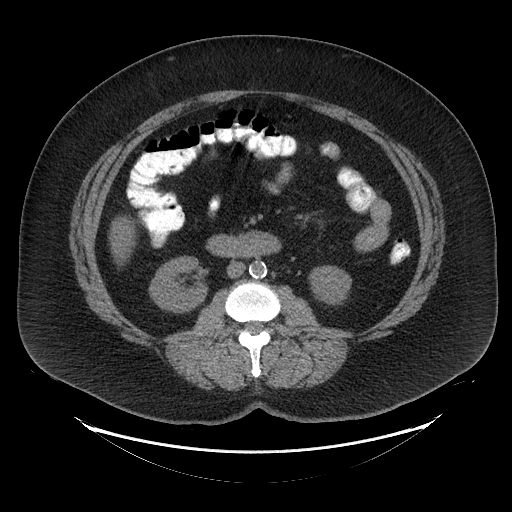
[im 54/97  lung]
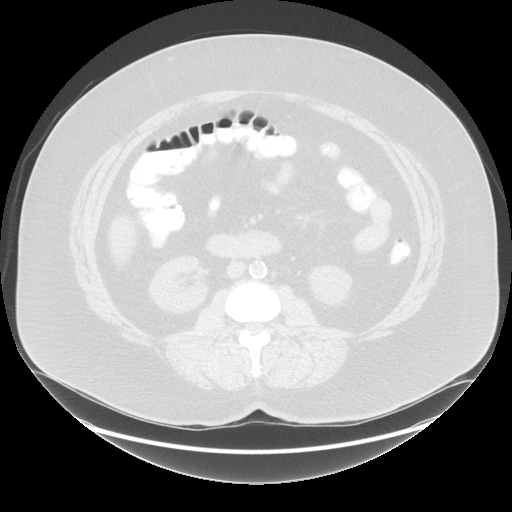
[im 65/97  soft-tissue]
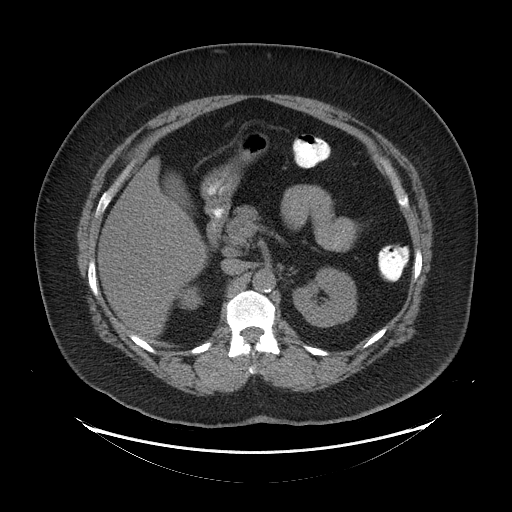
[im 65/97  lung]
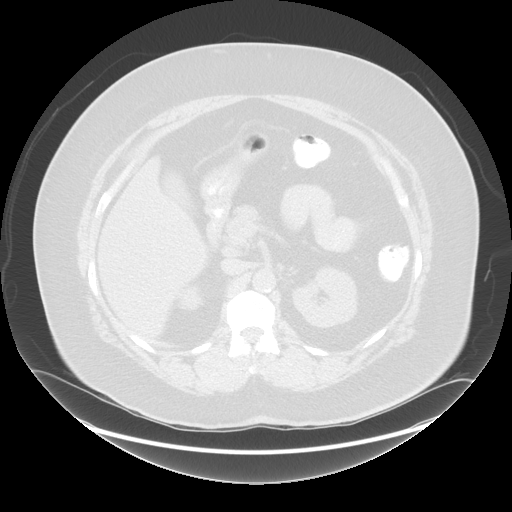
[im 75/97  soft-tissue]
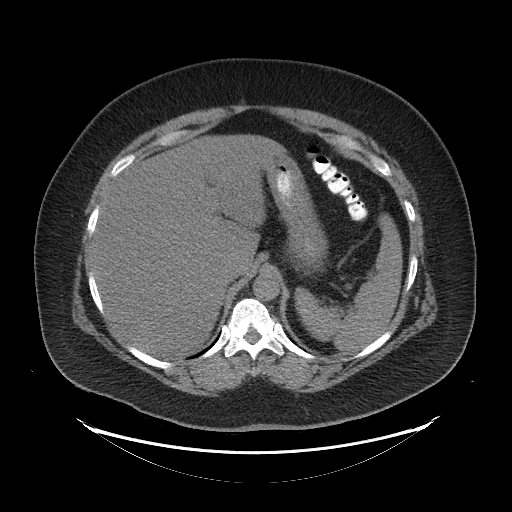
[im 75/97  lung]
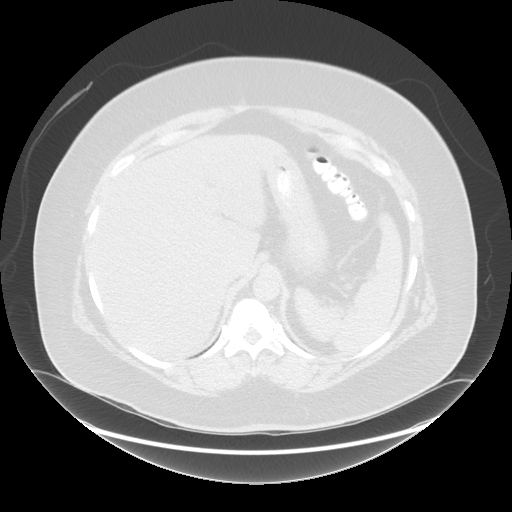
[im 86/97  soft-tissue]
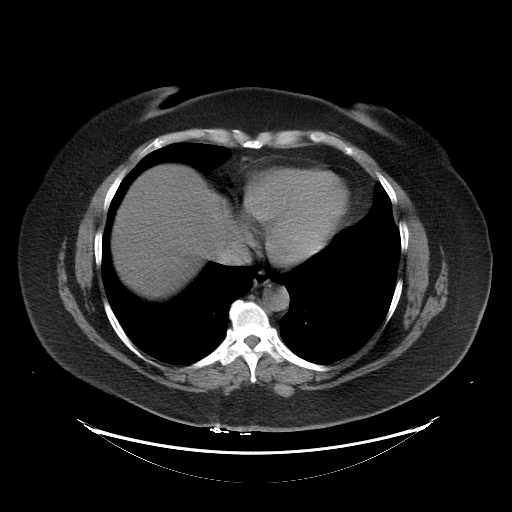
[im 86/97  lung]
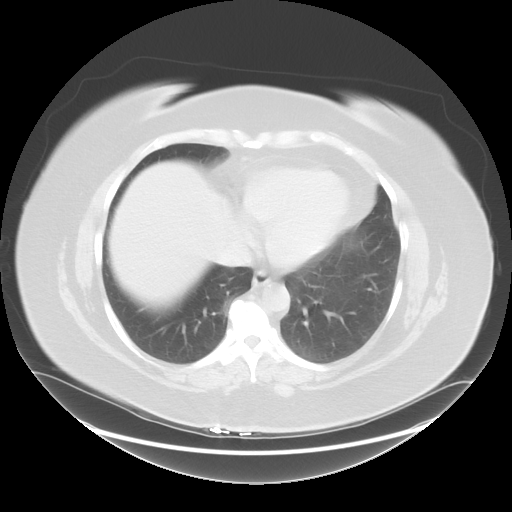

[Series 601: coronal body · coronal · 0.94mm/px · 1 of 162 slices shown, 2 images]
[im 54/162  soft-tissue]
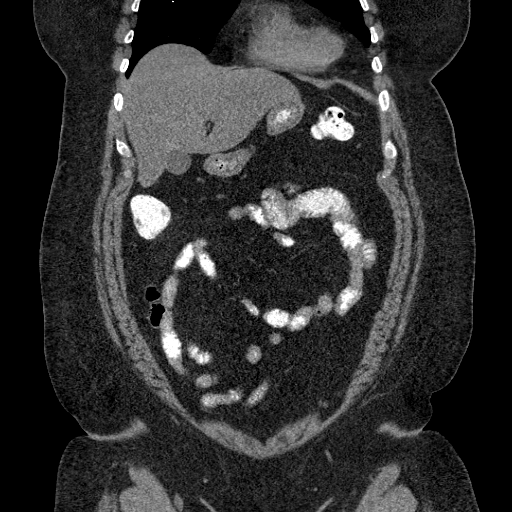
[im 54/162  bone]
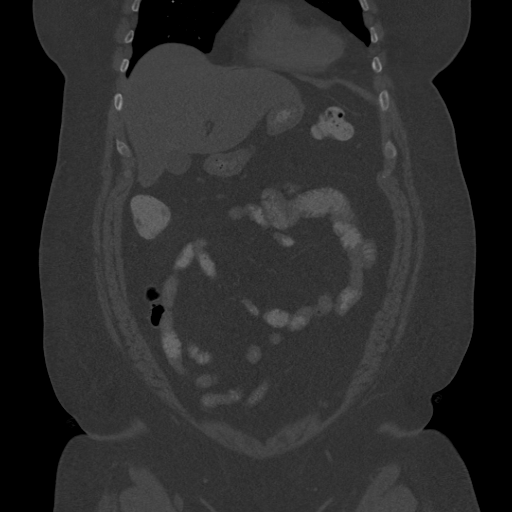

[Series 602: sagittal body · sagittal · 0.94mm/px · 4 of 183 slices shown]
[im 21/183  soft-tissue]
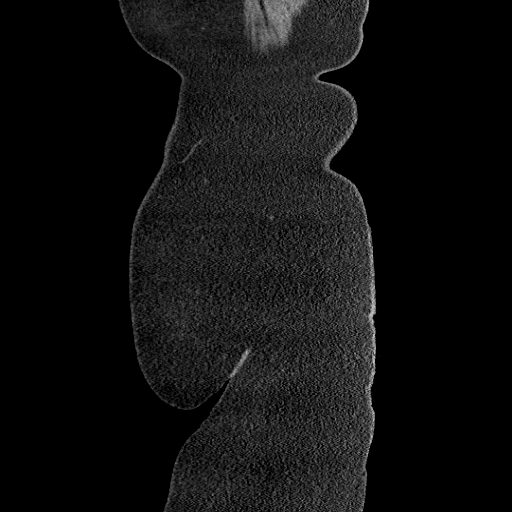
[im 41/183  soft-tissue]
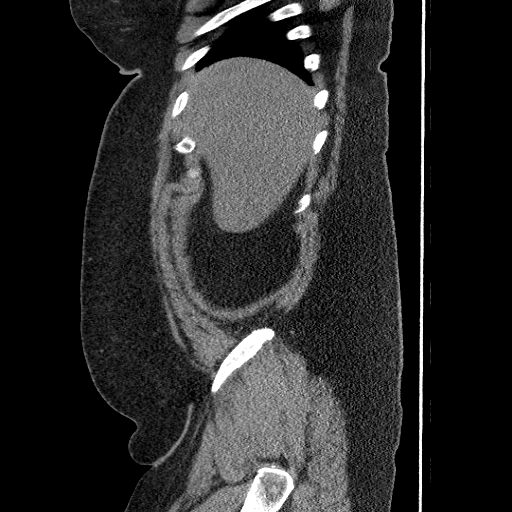
[im 61/183  soft-tissue]
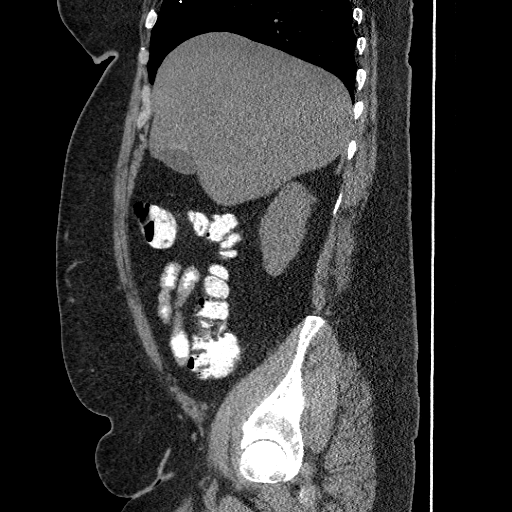
[im 81/183  soft-tissue]
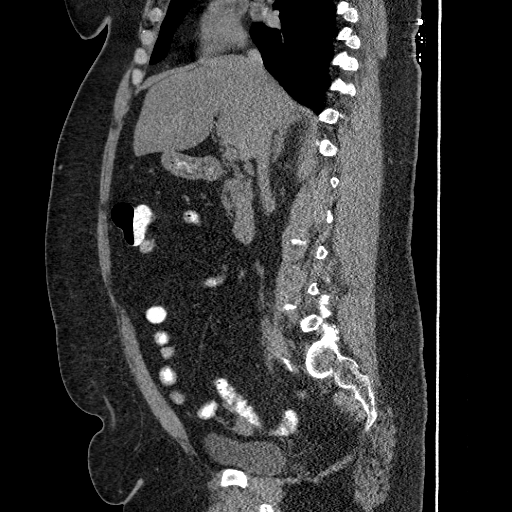

[13 of 36 positions shown; findings below may reference images not displayed]

FINDINGS: Lower chest: No acute findings.

Hepatobiliary: No mass visualized on this unenhanced exam. Mild
hepatic steatosis. Gallbladder is unremarkable.

Pancreas: No mass or inflammatory process visualized on this
unenhanced exam.

Spleen:  Within normal limits in size.

Adrenals/Urinary tract: No evidence of urolithiasis or
hydronephrosis. Unremarkable unopacified urinary bladder.

Stomach/Bowel: No evidence of obstruction, inflammatory process, or
abnormal fluid collections. Normal appendix visualized.

Vascular/Lymphatic: No pathologically enlarged lymph nodes
identified. No evidence of abdominal aortic aneurysm. Aortic
atherosclerosis.

Reproductive:  No mass or other significant abnormality.

Other:  None.

Musculoskeletal:  No suspicious bone lesions identified.
IMPRESSION: No acute findings.

Mild hepatic steatosis.

Aortic atherosclerosis.

## 2019-02-12 ENCOUNTER — Other Ambulatory Visit: Payer: Self-pay

## 2019-02-12 ENCOUNTER — Ambulatory Visit: Payer: Federal, State, Local not specified - PPO | Admitting: Podiatry

## 2019-02-12 ENCOUNTER — Encounter: Payer: Self-pay | Admitting: Podiatry

## 2019-02-12 VITALS — Temp 97.4°F

## 2019-02-12 DIAGNOSIS — B351 Tinea unguium: Secondary | ICD-10-CM

## 2019-02-12 DIAGNOSIS — L603 Nail dystrophy: Secondary | ICD-10-CM | POA: Diagnosis not present

## 2019-02-14 DIAGNOSIS — E1165 Type 2 diabetes mellitus with hyperglycemia: Secondary | ICD-10-CM | POA: Diagnosis not present

## 2019-02-21 DIAGNOSIS — G5712 Meralgia paresthetica, left lower limb: Secondary | ICD-10-CM | POA: Diagnosis not present

## 2019-02-21 DIAGNOSIS — E782 Mixed hyperlipidemia: Secondary | ICD-10-CM | POA: Diagnosis not present

## 2019-02-21 DIAGNOSIS — S46002A Unspecified injury of muscle(s) and tendon(s) of the rotator cuff of left shoulder, initial encounter: Secondary | ICD-10-CM | POA: Diagnosis not present

## 2019-02-21 DIAGNOSIS — E1165 Type 2 diabetes mellitus with hyperglycemia: Secondary | ICD-10-CM | POA: Diagnosis not present

## 2019-02-21 NOTE — Progress Notes (Signed)
Subjective:   Patient ID: Brenda Johnson, female   DOB: 54 y.o.   MRN: [id number]   HPI 53 year old female presents to the office for concerns of toenail issues.  She is to the right big toenail becoming dark nails have been loose and coming off.  She had no recent treatment.  No pain.  No redness or drainage or signs of infection otherwise that she reports.   Review of Systems  All other systems reviewed and are negative.  Past Medical History:  Diagnosis Date  . High cholesterol   . Hypertension     Past Surgical History:  Procedure Laterality Date  . TUBAL LIGATION       Current Outpatient Medications:  .  famotidine (PEPCID) 20 MG tablet, Take by mouth., Disp: , Rfl:  .  aspirin EC 81 MG tablet, Take by mouth., Disp: , Rfl:  .  atorvastatin (LIPITOR) 10 MG tablet, Take 10 mg by mouth daily., Disp: , Rfl:  .  cholecalciferol (VITAMIN D) 25 MCG (1000 UT) tablet, Take by mouth., Disp: , Rfl:  .  letrozole (FEMARA) 2.5 MG tablet, , Disp: , Rfl:  .  levothyroxine (SYNTHROID, LEVOTHROID) 50 MCG tablet, Take 50 mcg by mouth daily before breakfast., Disp: , Rfl:  .  lisinopril (PRINIVIL,ZESTRIL) 40 MG tablet, Take 40 mg by mouth 2 (two) times daily. , Disp: , Rfl:  .  metFORMIN (GLUCOPHAGE) 500 MG tablet, Take by mouth 2 (two) times daily with a meal., Disp: , Rfl:  .  vitamin E 400 UNIT capsule, Take by mouth., Disp: , Rfl:   Allergies  Allergen Reactions  . Magnesium Sulfate Rash  . Aleve [Naproxen Sodium]          Objective:  Physical Exam  General: AAO x3, NAD  Dermatological: The nails are dystrophic and discolored.  The right hallux toenail was found discoloration with some yellow discoloration as well.  There is no pain the nails there is no surrounding redness or drainage or signs of infection.  No open lesions.  Vascular: Dorsalis Pedis artery and Posterior Tibial artery pedal pulses are 2/4 bilateral with immedate capillary fill time.There is no pain with calf  compression, swelling, warmth, erythema.   Neruologic: Grossly intact via light touch bilateral. Protective threshold with Semmes Wienstein monofilament intact to all pedal sites bilateral.   Musculoskeletal: No gross boney pedal deformities bilateral. No pain, crepitus, or limitation noted with foot and ankle range of motion bilateral. Muscular strength 5/5 in all groups tested bilateral.  Gait: Unassisted, Nonantalgic.       Assessment:   53 year old female onychodystrophy, likely onychomycosis     Plan:  -Treatment options discussed including all alternatives, risks, and complications -Etiology of symptoms were discussed -Nails were sharply debrided without any complications.  Sent this for culture to [facility] labs.  Specimen was given to Cranford Mon, CMA -Discussed treatment options over the results of the culture before proceeding with definitive treatment.  Trula Slade DPM

## 2019-03-01 NOTE — Progress Notes (Signed)
Spoke with the patient and relayed the message to the patient per Dr Jacqualyn Posey. Lattie Haw

## 2019-03-20 DIAGNOSIS — I10 Essential (primary) hypertension: Secondary | ICD-10-CM | POA: Diagnosis not present

## 2019-03-20 DIAGNOSIS — E039 Hypothyroidism, unspecified: Secondary | ICD-10-CM | POA: Diagnosis not present

## 2019-03-20 DIAGNOSIS — C50412 Malignant neoplasm of upper-outer quadrant of left female breast: Secondary | ICD-10-CM | POA: Diagnosis not present

## 2019-03-20 DIAGNOSIS — Z9221 Personal history of antineoplastic chemotherapy: Secondary | ICD-10-CM | POA: Diagnosis not present

## 2019-03-20 DIAGNOSIS — D649 Anemia, unspecified: Secondary | ICD-10-CM | POA: Diagnosis not present

## 2019-03-20 DIAGNOSIS — Z08 Encounter for follow-up examination after completed treatment for malignant neoplasm: Secondary | ICD-10-CM | POA: Diagnosis not present

## 2019-03-20 DIAGNOSIS — D72810 Lymphocytopenia: Secondary | ICD-10-CM | POA: Diagnosis not present

## 2019-03-20 DIAGNOSIS — Z6841 Body Mass Index (BMI) 40.0 and over, adult: Secondary | ICD-10-CM | POA: Diagnosis not present

## 2019-03-20 DIAGNOSIS — Z1379 Encounter for other screening for genetic and chromosomal anomalies: Secondary | ICD-10-CM | POA: Diagnosis not present

## 2019-03-20 DIAGNOSIS — Z79811 Long term (current) use of aromatase inhibitors: Secondary | ICD-10-CM | POA: Diagnosis not present

## 2019-03-20 DIAGNOSIS — Z923 Personal history of irradiation: Secondary | ICD-10-CM | POA: Diagnosis not present

## 2019-03-20 DIAGNOSIS — Z853 Personal history of malignant neoplasm of breast: Secondary | ICD-10-CM | POA: Diagnosis not present

## 2019-07-10 DIAGNOSIS — C50412 Malignant neoplasm of upper-outer quadrant of left female breast: Secondary | ICD-10-CM | POA: Diagnosis not present

## 2019-07-10 DIAGNOSIS — Z170 Estrogen receptor positive status [ER+]: Secondary | ICD-10-CM | POA: Diagnosis not present

## 2019-07-22 ENCOUNTER — Other Ambulatory Visit: Payer: Self-pay

## 2019-07-23 ENCOUNTER — Encounter: Payer: Self-pay | Admitting: Women's Health

## 2019-07-23 ENCOUNTER — Ambulatory Visit (INDEPENDENT_AMBULATORY_CARE_PROVIDER_SITE_OTHER): Payer: Federal, State, Local not specified - PPO | Admitting: Women's Health

## 2019-07-23 VITALS — BP 118/84 | Ht 64.25 in | Wt 246.0 lb

## 2019-07-23 DIAGNOSIS — Z01419 Encounter for gynecological examination (general) (routine) without abnormal findings: Secondary | ICD-10-CM

## 2019-07-23 NOTE — Patient Instructions (Signed)
Good to see you!  Health Maintenance for Postmenopausal Women Menopause is a normal process in which your ability to get pregnant comes to an end. This process happens slowly over many months or years, usually between the ages of 62 and 81. Menopause is complete when you have missed your menstrual periods for 12 months. It is important to talk with your health care provider about some of the most common conditions that affect women after menopause (postmenopausal women). These include heart disease, cancer, and bone loss (osteoporosis). Adopting a healthy lifestyle and getting preventive care can help to promote your health and wellness. The actions you take can also lower your chances of developing some of these common conditions. What should I know about menopause? During menopause, you may get a number of symptoms, such as:  Hot flashes. These can be moderate or severe.  Night sweats.  Decrease in sex drive.  Mood swings.  Headaches.  Tiredness.  Irritability.  Memory problems.  Insomnia. Choosing to treat or not to treat these symptoms is a decision that you make with your health care provider. Do I need hormone replacement therapy?  Hormone replacement therapy is effective in treating symptoms that are caused by menopause, such as hot flashes and night sweats.  Hormone replacement carries certain risks, especially as you become older. If you are thinking about using estrogen or estrogen with progestin, discuss the benefits and risks with your health care provider. What is my risk for heart disease and stroke? The risk of heart disease, heart attack, and stroke increases as you age. One of the causes may be a change in the body's hormones during menopause. This can affect how your body uses dietary fats, triglycerides, and cholesterol. Heart attack and stroke are medical emergencies. There are many things that you can do to help prevent heart disease and stroke. Watch your blood  pressure  High blood pressure causes heart disease and increases the risk of stroke. This is more likely to develop in people who have high blood pressure readings, are of African descent, or are overweight.  Have your blood pressure checked: ? Every 3-5 years if you are 17-24 years of age. ? Every year if you are 54 or older years old or older. Eat a healthy diet   Eat a diet that includes plenty of vegetables, fruits, low-fat dairy products, and lean protein.  Do not eat a lot of foods that are high in solid fats, added sugars, or sodium. Get regular exercise Get regular exercise. This is one of the most important things you can do for your health. Most adults should:  Try to exercise for at least 150 minutes each week. The exercise should increase your heart rate and make you sweat (moderate-intensity exercise).  Try to do strengthening exercises at least twice each week. Do these in addition to the moderate-intensity exercise.  Spend less time sitting. Even light physical activity can be beneficial. Other tips  Work with your health care provider to achieve or maintain a healthy weight.  Do not use any products that contain nicotine or tobacco, such as cigarettes, e-cigarettes, and chewing tobacco. If you need help quitting, ask your health care provider.  Know your numbers. Ask your health care provider to check your cholesterol and your blood sugar (glucose). Continue to have your blood tested as directed by your health care provider. Do I need screening for cancer? Depending on your health history and family history, you may need to have cancer screening at different  stages of your life. This may include screening for:  Breast cancer.  Cervical cancer.  Lung cancer.  Colorectal cancer. What is my risk for osteoporosis? After menopause, you may be at increased risk for osteoporosis. Osteoporosis is a condition in which bone destruction happens more quickly than new bone creation.  To help prevent osteoporosis or the bone fractures that can happen because of osteoporosis, you may take the following actions:  If you are 54-54 years old, get at least 1,000 mg of calcium and at least 600 mg of vitamin D per day.  If you are older than age 54 but younger than age 31, get at least 1,200 mg of calcium and at least 600 mg of vitamin D per day.  If you are older than age 54, get at least 1,200 mg of calcium and at least 800 mg of vitamin D per day. Smoking and drinking excessive alcohol increase the risk of osteoporosis. Eat foods that are rich in calcium and vitamin D, and do weight-bearing exercises several times each week as directed by your health care provider. How does menopause affect my mental health? Depression may occur at any age, but it is more common as you become older. Common symptoms of depression include:  Low or sad mood.  Changes in sleep patterns.  Changes in appetite or eating patterns.  Feeling an overall lack of motivation or enjoyment of activities that you previously enjoyed.  Frequent crying spells. Talk with your health care provider if you think that you are experiencing depression. General instructions See your health care provider for regular wellness exams and vaccines. This may include:  Scheduling regular health, dental, and eye exams.  Getting and maintaining your vaccines. These include: ? Influenza vaccine. Get this vaccine each year before the flu season begins. ? Pneumonia vaccine. ? Shingles vaccine. ? Tetanus, diphtheria, and pertussis (Tdap) booster vaccine. Your health care provider may also recommend other immunizations. Tell your health care provider if you have ever been abused or do not feel safe at home. Summary  Menopause is a normal process in which your ability to get pregnant comes to an end.  This condition causes hot flashes, night sweats, decreased interest in sex, mood swings, headaches, or lack of sleep.   Treatment for this condition may include hormone replacement therapy.  Take actions to keep yourself healthy, including exercising regularly, eating a healthy diet, watching your weight, and checking your blood pressure and blood sugar levels.  Get screened for cancer and depression. Make sure that you are up to date with all your vaccines. This information is not intended to replace advice given to you by your health care provider. Make sure you discuss any questions you have with your health care provider. Document Released: 10/14/2005 Document Revised: 08/15/2018 Document Reviewed: 08/15/2018 Elsevier Patient Education  2020 [employer].

## 2019-07-23 NOTE — Progress Notes (Signed)
Brenda Johnson 06-Dec-1965 [id number]    History:    Presents for annual exam.  Postmenopausal on no HRT with no bleeding.  10/2017 diagnosed with left breast cancer, lumpectomy 03/2018, chemo and radiation, done through [facility].  Triple positive  on Femara, tolerating well.  BRCA negative.  Has some hot flashes and vaginal dryness but tolerable.  Primary care manages hypertension, hypothyroidism, hypercholesteremia.  Normal DEXA primary care.  Has not had a screening colonoscopy but plans to schedule this year.  Normal Pap history.  Biggest problem last year son Brenda Johnson died of a drug overdose May 30, 2018.  Past medical history, past surgical history, family history and social history were all reviewed and documented in the EPIC chart.  Married reports good family support, husband shaved his head when her hair fell out with chemo.  Daughter completing PhD in counseling.  Has 3 granddaughters all doing well.  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  ROS:  A ROS was performed and pertinent positives and negatives are included.  Exam:  Vitals:   07/23/19 1213  BP: 118/84  Weight: 246 lb (111.6 kg)  Height: 5' 4.25" (1.632 m)   Body mass index is 41.9 kg/m.   General appearance:  Normal Thyroid:  Symmetrical, normal in size, without palpable masses or nodularity. Respiratory  Auscultation:  Clear without wheezing or rhonchi Cardiovascular  Auscultation:  Regular rate, without rubs, murmurs or gallops  Edema/varicosities:  Not grossly evident Abdominal  Soft,nontender, without masses, guarding or rebound.  Liver/spleen:  No organomegaly noted  Hernia:  None appreciated  Skin  Inspection:  Grossly normal   Breasts: Examined lying and sitting.     Right: Without masses, retractions, discharge or axillary adenopathy.     Left: Outer aspect skin thickening, skin discoloration with some dimpling, lumpectomy scar well-healed Gentitourinary    Inguinal/mons:  Normal without inguinal adenopathy  External genitalia:  Normal  BUS/Urethra/Skene's glands:  Normal  Vagina: Atrophic   Cervix:  Normal  Uterus:  normal in size, shape and contour.  Midline and mobile  Adnexa/parametria:     Rt: Without masses or tenderness.   Lt: Without masses or tenderness.  Anus and perineum: Normal  Digital rectal exam: Normal sphincter tone without palpated masses or tenderness  Assessment/Plan:  53 y.o. MWF G2P2 (1 deceased 05/30/2018 from drug overdose)  for annual exam with no complaints.  Postmenopausal no HRT with no bleeding 10/2017 left breast cancer lumpectomy, radiation, chemo on femara, oncologist at [facility] manages Hypertension, hypothyroidism, hypercholesteremia, diabetes-primary care manages labs and meds Obesity  Plan: SBEs, continue annual 3D screening mammogram, calcium rich foods, vitamin D 1000 daily encouraged.  Reviewed importance of increasing weightbearing and balance type exercise, yoga encouraged.  Self-care, leisure activities and need to decrease calorie/carbs discussed. Encouraged a grief support group as needed- loss of son.  Pap normal 2018, new screening guidelines reviewed. Has follow-up appointment with oncologist tomorrow will discuss flu vaccine    Huel Cote [facility], 12:54 PM 07/23/2019

## 2019-07-24 DIAGNOSIS — Z79811 Long term (current) use of aromatase inhibitors: Secondary | ICD-10-CM | POA: Diagnosis not present

## 2019-07-24 DIAGNOSIS — Z1379 Encounter for other screening for genetic and chromosomal anomalies: Secondary | ICD-10-CM | POA: Diagnosis not present

## 2019-07-24 DIAGNOSIS — D649 Anemia, unspecified: Secondary | ICD-10-CM | POA: Diagnosis not present

## 2019-07-24 DIAGNOSIS — Z853 Personal history of malignant neoplasm of breast: Secondary | ICD-10-CM | POA: Diagnosis not present

## 2019-07-24 DIAGNOSIS — Z08 Encounter for follow-up examination after completed treatment for malignant neoplasm: Secondary | ICD-10-CM | POA: Diagnosis not present

## 2019-07-24 DIAGNOSIS — Z5181 Encounter for therapeutic drug level monitoring: Secondary | ICD-10-CM | POA: Diagnosis not present

## 2019-07-24 DIAGNOSIS — D72810 Lymphocytopenia: Secondary | ICD-10-CM | POA: Diagnosis not present

## 2019-07-24 DIAGNOSIS — C50412 Malignant neoplasm of upper-outer quadrant of left female breast: Secondary | ICD-10-CM | POA: Diagnosis not present

## 2019-07-24 DIAGNOSIS — I10 Essential (primary) hypertension: Secondary | ICD-10-CM | POA: Diagnosis not present

## 2019-07-24 DIAGNOSIS — Z170 Estrogen receptor positive status [ER+]: Secondary | ICD-10-CM | POA: Diagnosis not present

## 2019-07-24 DIAGNOSIS — Z6841 Body Mass Index (BMI) 40.0 and over, adult: Secondary | ICD-10-CM | POA: Diagnosis not present

## 2019-07-31 ENCOUNTER — Encounter: Payer: Self-pay | Admitting: Emergency Medicine

## 2019-07-31 ENCOUNTER — Other Ambulatory Visit: Payer: Self-pay

## 2019-07-31 ENCOUNTER — Ambulatory Visit
Admission: EM | Admit: 2019-07-31 | Discharge: 2019-07-31 | Disposition: A | Discharge status: Home / Self Care | Payer: Federal, State, Local not specified - PPO

## 2019-07-31 DIAGNOSIS — Z20828 Contact with and (suspected) exposure to other viral communicable diseases: Secondary | ICD-10-CM | POA: Diagnosis not present

## 2019-07-31 DIAGNOSIS — R05 Cough: Secondary | ICD-10-CM

## 2019-07-31 DIAGNOSIS — R059 Cough, unspecified: Secondary | ICD-10-CM

## 2019-07-31 DIAGNOSIS — Z20822 Contact with and (suspected) exposure to covid-19: Secondary | ICD-10-CM

## 2019-07-31 NOTE — ED Notes (Signed)
Patient able to ambulate independently  

## 2019-07-31 NOTE — ED Provider Notes (Signed)
[name]    CSN: [id number] Arrival date & time: 07/31/19  1539      History   Chief Complaint Chief Complaint  Patient presents with  . COVID-Like Symptoms    HPI Brenda Johnson is a 53 y.o. female with history of hypertension, breast cancer presenting for 3-day course of headache, "feeling off ", productive, nonhemoptic cough, weakness.  States her husband tested +3 days ago, though had symptoms a few days prior.  Patient states she is taking Tylenol with adequate relief of mild frontal headache.  Denies room spinning sensation, nausea, vomiting.   Past Medical History:  Diagnosis Date  . Cancer ([place]) 10/2017   BREAST -CHEMO AND RADIATION  . High cholesterol   . Hypertension     Patient Active Problem List   Diagnosis Date Noted  . Encounter for monitoring aromatase inhibitor therapy 12/06/2018  . Dizziness 08/24/2018  . Hypomagnesemia 03/05/2018  . Lymphopenia 01/26/2018  . Normocytic normochromic anemia 01/26/2018  . Genetic testing 11/06/2017  . Class 3 severe obesity due to excess calories with serious comorbidity and body mass index (BMI) of 40.0 to 44.9 in adult ([place]) 10/31/2017  . Essential hypertension 10/20/2017  . Malignant neoplasm of upper-outer quadrant of left breast in female, estrogen receptor positive ([place]) 10/17/2017  . Hypothyroidism 10/27/2016  . Diabetes ([place]) 10/27/2016    Past Surgical History:  Procedure Laterality Date  . BREAST SURGERY Left 2019   LUMPECTOMY   . TUBAL LIGATION      OB History    Gravida  2   Para  2   Term      Preterm      AB      Living  2     SAB      TAB      Ectopic      Multiple      Live Births               Home Medications    Prior to Admission medications   Medication Sig Start Date End Date Taking? Authorizing Provider  Multiple Vitamins-Minerals (HAIR SKIN AND NAILS FORMULA PO) Take by mouth.   Yes [provider]  aspirin EC 81 MG tablet Take by mouth.     [provider]  atorvastatin (LIPITOR) 10 MG tablet Take 10 mg by mouth daily.    [provider]  cholecalciferol (VITAMIN D) 25 MCG (1000 UT) tablet Take by mouth.    [provider]  famotidine (PEPCID) 20 MG tablet Take by mouth. 12/19/17   [provider]  letrozole [facility]) 2.5 MG tablet  12/03/18   [provider]  levothyroxine (SYNTHROID) 75 MCG tablet Take 75 mcg by mouth daily before breakfast.    [provider]  lisinopril (PRINIVIL,ZESTRIL) 40 MG tablet Take 40 mg by mouth 2 (two) times daily.     [provider]  metFORMIN (GLUCOPHAGE) 500 MG tablet Take by mouth 2 (two) times daily with a meal.    [provider]  vitamin E 400 UNIT capsule Take by mouth.    [provider]    Family History Family History  Problem Relation Age of Onset  . Cancer Mother        pancreatic, uterus/cervical  . Hypertension Father   . Cancer Father        lung -   . Hypertension Sister   . Hypertension Brother   . Asthma Brother   . Seizures Brother  mentally handicapp  . Thyroid disease Sister     Social History Social History   Tobacco Use  . Smoking status: Former Smoker    Quit date: 10/27/1996    Years since quitting: 22.7  . Smokeless tobacco: Never Used  Substance Use Topics  . Alcohol use: No    Comment: VERY RARELY  . Drug use: Not on file     Allergies   Magnesium sulfate and Aleve [naproxen sodium]   Review of Systems Review of Systems  Constitutional: Positive for fatigue. Negative for activity change, appetite change and fever.  HENT: Negative for ear pain, sinus pain, sore throat and voice change.   Eyes: Negative for pain, redness and visual disturbance.  Respiratory: Positive for cough. Negative for shortness of breath and wheezing.   Cardiovascular: Negative for chest pain and palpitations.  Gastrointestinal: Negative for abdominal pain, diarrhea and vomiting.   Musculoskeletal: Positive for myalgias. Negative for arthralgias.  Skin: Negative for rash and wound.  Neurological: Positive for light-headedness and headaches. Negative for dizziness, tremors, syncope, facial asymmetry and weakness.     Physical Exam Triage Vital Signs ED Triage Vitals  Enc Vitals Group     BP 07/31/19 1552 (!) 142/87     Pulse Rate 07/31/19 1552 (!) 103     Resp 07/31/19 1552 18     Temp 07/31/19 1552 98.5 F (36.9 C)     Temp Source 07/31/19 1552 Oral     SpO2 07/31/19 1552 94 %     Weight --      Height --      Head Circumference --      Peak Flow --      Pain Score 07/31/19 1559 2     Pain Loc --      Pain Edu? --      Excl. in [place]? --    No data found.  Updated Vital Signs BP (!) 142/87 (BP Location: Right Arm)   Pulse (!) 103   Temp 98.5 F (36.9 C) (Oral)   Resp 18   LMP 06/27/2015   SpO2 94%   Visual Acuity Right Eye Distance:   Left Eye Distance:   Bilateral Distance:    Right Eye Near:   Left Eye Near:    Bilateral Near:     Physical Exam Constitutional:      General: She is not in acute distress.    Appearance: She is obese. She is not toxic-appearing.  HENT:     Head: Normocephalic and atraumatic.     Mouth/Throat:     Mouth: Mucous membranes are moist.     Pharynx: Oropharynx is clear. No oropharyngeal exudate or posterior oropharyngeal erythema.  Eyes:     General: No scleral icterus.    Pupils: Pupils are equal, round, and reactive to light.  Cardiovascular:     Rate and Rhythm: Normal rate and regular rhythm.     Pulses: Normal pulses.     Comments: HR 92-98 with provider Pulmonary:     Effort: Pulmonary effort is normal. No respiratory distress.     Breath sounds: No stridor. No wheezing or rales.  Musculoskeletal: Normal range of motion.     Right lower leg: No edema.     Left lower leg: No edema.  Skin:    General: Skin is warm.     Capillary Refill: Capillary refill takes less than 2 seconds.     Coloration:  Skin is not jaundiced or pale.  Neurological:  Mental Status: She is alert and oriented to person, place, and time.     Cranial Nerves: No cranial nerve deficit.     Sensory: No sensory deficit.     Motor: No weakness.     Coordination: Coordination normal.     Gait: Gait normal.     Deep Tendon Reflexes: Reflexes normal.     Comments: Symptoms not reproduced during exam  Psychiatric:        Mood and Affect: Mood normal.        Behavior: Behavior normal.      UC Treatments / Results  Labs (all labs ordered are listed, but only abnormal results are displayed) Labs Reviewed  NOVEL CORONAVIRUS, NAA    EKG   Radiology No results found.  Procedures Procedures (including critical care time)  Medications Ordered in UC Medications - No data to display  Initial Impression / Assessment and Plan / UC Course  I have reviewed the triage vital signs and the nursing notes.  Pertinent labs & imaging results that were available during my care of the patient were reviewed by me and considered in my medical decision making (see chart for details).     Covid PCR test pending: Patient to quarantine in the interim.  Will continue supportive care as discussed at appointment.  Return precautions discussed, patient verbalized understanding and is agreeable to plan. Final Clinical Impressions(s) / UC Diagnoses   Final diagnoses:  Cough  Exposure to COVID-19 virus     Discharge Instructions     Your COVID test is pending - it is important to quarantine / isolate at home until your results are back. If you test positive and would like further evaluation for persistent or worsening symptoms, you may schedule an E-visit or virtual (video) visit throughout the [facility] app or website.  PLEASE NOTE: If you develop severe chest pain or shortness of breath please go to the ER or call 9-1-1 for further evaluation --> DO NOT schedule electronic or virtual visits for this. Please  call our office for further guidance / recommendations as needed.    ED Prescriptions    None     PDMP not reviewed this encounter.   Neldon Mc [place], [place] 07/31/19 1950

## 2019-07-31 NOTE — Discharge Instructions (Addendum)
Your COVID test is pending - it is important to quarantine / isolate at home until your results are back. °If you test positive and would like further evaluation for persistent or worsening symptoms, you may schedule an E-visit or virtual (video) visit throughout the [facility] MyChart app or website. ° °PLEASE NOTE: If you develop severe chest pain or shortness of breath please go to the ER or call 9-1-1 for further evaluation --> DO NOT schedule electronic or virtual visits for this. °Please call our office for further guidance / recommendations as needed. °

## 2019-07-31 NOTE — ED Triage Notes (Signed)
Pt presents to [facility] for headache, fuzziness, cough with yellow phlegm, weakness x 3 days, after her husband tested positive for COVID 3 days ago.

## 2019-08-01 LAB — NOVEL CORONAVIRUS, NAA: SARS-CoV-2, NAA: DETECTED — AB

## 2019-08-04 ENCOUNTER — Telehealth (HOSPITAL_COMMUNITY): Payer: Self-pay | Admitting: Emergency Medicine

## 2019-08-04 NOTE — Telephone Encounter (Signed)
Positive covid detected on sample. Patient contacted by phone and made aware of    results. Pt verbalized understanding and had all questions answered. Quarantine ends Dec 4th as long as no fever, symptoms improving.

## 2019-09-25 DIAGNOSIS — Z7189 Other specified counseling: Secondary | ICD-10-CM | POA: Diagnosis not present

## 2019-09-25 DIAGNOSIS — E1165 Type 2 diabetes mellitus with hyperglycemia: Secondary | ICD-10-CM | POA: Diagnosis not present

## 2019-09-25 DIAGNOSIS — E782 Mixed hyperlipidemia: Secondary | ICD-10-CM | POA: Diagnosis not present

## 2019-10-02 DIAGNOSIS — I63422 Cerebral infarction due to embolism of left anterior cerebral artery: Secondary | ICD-10-CM | POA: Diagnosis not present

## 2019-10-02 DIAGNOSIS — I6782 Cerebral ischemia: Secondary | ICD-10-CM | POA: Diagnosis not present

## 2019-10-02 DIAGNOSIS — E782 Mixed hyperlipidemia: Secondary | ICD-10-CM | POA: Diagnosis not present

## 2019-10-02 DIAGNOSIS — E1129 Type 2 diabetes mellitus with other diabetic kidney complication: Secondary | ICD-10-CM | POA: Diagnosis not present

## 2019-10-02 DIAGNOSIS — E1165 Type 2 diabetes mellitus with hyperglycemia: Secondary | ICD-10-CM | POA: Diagnosis not present

## 2019-10-04 DIAGNOSIS — I63442 Cerebral infarction due to embolism of left cerebellar artery: Secondary | ICD-10-CM | POA: Diagnosis not present

## 2019-10-17 DIAGNOSIS — I63422 Cerebral infarction due to embolism of left anterior cerebral artery: Secondary | ICD-10-CM | POA: Diagnosis not present

## 2019-10-17 DIAGNOSIS — R0600 Dyspnea, unspecified: Secondary | ICD-10-CM | POA: Diagnosis not present

## 2019-10-22 DIAGNOSIS — C50412 Malignant neoplasm of upper-outer quadrant of left female breast: Secondary | ICD-10-CM | POA: Diagnosis not present

## 2019-10-22 DIAGNOSIS — Z170 Estrogen receptor positive status [ER+]: Secondary | ICD-10-CM | POA: Diagnosis not present

## 2019-10-22 DIAGNOSIS — R928 Other abnormal and inconclusive findings on diagnostic imaging of breast: Secondary | ICD-10-CM | POA: Diagnosis not present

## 2019-10-23 DIAGNOSIS — E782 Mixed hyperlipidemia: Secondary | ICD-10-CM | POA: Diagnosis not present

## 2019-10-23 DIAGNOSIS — E1165 Type 2 diabetes mellitus with hyperglycemia: Secondary | ICD-10-CM | POA: Diagnosis not present

## 2019-10-23 DIAGNOSIS — I63422 Cerebral infarction due to embolism of left anterior cerebral artery: Secondary | ICD-10-CM | POA: Diagnosis not present

## 2019-10-30 ENCOUNTER — Encounter: Payer: Self-pay | Admitting: Neurology

## 2019-10-30 ENCOUNTER — Ambulatory Visit: Payer: Federal, State, Local not specified - PPO | Admitting: Neurology

## 2019-10-30 ENCOUNTER — Other Ambulatory Visit: Payer: Self-pay

## 2019-10-30 VITALS — BP 131/73 | HR 76 | Temp 97.0°F | Ht 64.0 in | Wt 242.0 lb

## 2019-10-30 DIAGNOSIS — E669 Obesity, unspecified: Secondary | ICD-10-CM | POA: Diagnosis not present

## 2019-10-30 DIAGNOSIS — I6990 Unspecified sequelae of unspecified cerebrovascular disease: Secondary | ICD-10-CM

## 2019-10-30 DIAGNOSIS — R0683 Snoring: Secondary | ICD-10-CM | POA: Diagnosis not present

## 2019-10-30 DIAGNOSIS — I6381 Other cerebral infarction due to occlusion or stenosis of small artery: Secondary | ICD-10-CM | POA: Diagnosis not present

## 2019-10-30 NOTE — Progress Notes (Signed)
[facility] [address] [place]. [place] [address] [phone]       OFFICE CONSULT NOTE  Ms. Brenda Johnson Date of Birth:  09-Mar-1966 Medical Record Number:  [id number]   Referring MD: Merrilee Seashore Reason for Referral: Stroke HPI: Brenda Johnson is a 54 year old pleasant Caucasian lady seen today for initial office consultation visit for stroke.  History is obtained from the patient and review of electronic medical records in [facility] as well as referral notes.  As unable to obtain obtain actual films in PACS to look at.  She has past medical history of diabetes, hypertension, hyperlipidemia and left breast upper outer quadrant biopsy-proven stage Ib breast cancer and left axillary lymph node positive for metastasis disease.  She was treated with 6 cycles of neoadjuvant TCHP followed by Herceptin for 12 cycles and underwent left breast lumpectomy on 03/14/2018 with no residual disease.  Patient started complaining of headaches following her chemotherapy in December 2019 and her oncologist ordered an MRI scan of the brain which was done on 09/25/2019 at [facility] [place] which showed left frontal subcortical infarct.  Patient at that time denied any focal symptoms in the form of slurred speech right facial or body weakness or numbness or gait or balance problems.  She was started on aspirin 81 mg and states that she underwent echocardiogram which I do not have the results to review today but apparently was normal.  She also tells me she had carotid ultrasound which was also normal.  She was started on aspirin 81 which is tolerating well without bruising bleeding or other side effects.  She has had no recurrent stroke or TIA symptoms since then.  She had lipid profile checked last week by primary care physician and was told the results are satisfactory.  Blood pressure is under good control and today it is 131/73.  Last hemoglobin A1c was elevated at 7.9 on 09/25/2019  and a primary physician has added a new diabetes medication and plans to recheck it soon.  Patient denies any other prior history of strokes TIAs seizures or neurological problems.  She does admit to snoring and may be at risk for sleep apnea but has not had a formal sleep study done.  She does not smoke or drink and is no family history of strokes.  ROS:   14 system review of systems is positive for fatigue, palpitations, ringing in the ears, leg itching, snoring, feeling hot, runny nose, memory loss, weakness, dizziness, decreased energy, snoring and all other systems negative  PMH:  Past Medical History:  Diagnosis Date  . Cancer ([place]) 10/2017   BREAST -CHEMO AND RADIATION  . Diabetes mellitus without complication ([place])   . High cholesterol   . Hypertension     Social History:  Social History   Socioeconomic History  . Marital status: Married    Spouse name: Not on file  . Number of children: Not on file  . Years of education: Not on file  . Highest education level: Not on file  Occupational History  . Not on file  Tobacco Use  . Smoking status: Former Smoker    Quit date: 10/27/1996    Years since quitting: 23.0  . Smokeless tobacco: Never Used  Substance and Sexual Activity  . Alcohol use: No    Comment: VERY RARELY  . Drug use: Not Currently  . Sexual activity: Yes  Other Topics Concern  . Not on file  Social History Narrative  .  Not on file   Social Determinants of Health   Financial Resource Strain:   . Difficulty of Paying Living Expenses: Not on file  Food Insecurity:   . Worried About Charity fundraiser in the Last Year: Not on file  . Ran Out of Food in the Last Year: Not on file  Transportation Needs:   . Lack of Transportation (Medical): Not on file  . Lack of Transportation (Non-Medical): Not on file  Physical Activity:   . Days of Exercise per Week: Not on file  . Minutes of Exercise per Session: Not on file  Stress:   . Feeling of Stress : Not  on file  Social Connections:   . Frequency of Communication with Friends and Family: Not on file  . Frequency of Social Gatherings with Friends and Family: Not on file  . Attends Religious Services: Not on file  . Active Member of Clubs or Organizations: Not on file  . Attends Archivist Meetings: Not on file  . Marital Status: Not on file  Intimate Partner Violence:   . Fear of Current or Ex-Partner: Not on file  . Emotionally Abused: Not on file  . Physically Abused: Not on file  . Sexually Abused: Not on file    Medications:   Current Outpatient Medications on File Prior to Visit  Medication Sig Dispense Refill  . aspirin EC 81 MG tablet Take by mouth.    Marland Kitchen atorvastatin (LIPITOR) 10 MG tablet Take 10 mg by mouth daily.    Marland Kitchen BIOTIN PO Take by mouth.    . cholecalciferol (VITAMIN D) 25 MCG (1000 UT) tablet Take by mouth.    . famotidine (PEPCID) 20 MG tablet Take by mouth.    Marland Kitchen FARXIGA 10 MG TABS tablet Take 10 mg by mouth daily.    Marland Kitchen letrozole (FEMARA) 2.5 MG tablet     . levothyroxine (SYNTHROID) 75 MCG tablet Take 75 mcg by mouth daily before breakfast.    . lisinopril (PRINIVIL,ZESTRIL) 40 MG tablet Take 40 mg by mouth 2 (two) times daily.     . metFORMIN (GLUCOPHAGE) 500 MG tablet Take by mouth 2 (two) times daily with a meal.    . Multiple Vitamins-Minerals (HAIR SKIN AND NAILS FORMULA PO) Take by mouth.    . vitamin E 400 UNIT capsule Take by mouth.     No current facility-administered medications on file prior to visit.    Allergies:   Allergies  Allergen Reactions  . Magnesium Sulfate Rash  . Aleve [Naproxen Sodium]     Physical Exam General: Mildly obese middle-aged Caucasian lady, seated, in no evident distress Head: head normocephalic and atraumatic.   Neck: supple with no carotid or supraclavicular bruits Cardiovascular: regular rate and rhythm, no murmurs Musculoskeletal: no deformity Skin:  no rash/petichiae Vascular:  Normal pulses all  extremities  Neurologic Exam Mental Status: Awake and fully alert. Oriented to place and time. Recent and remote memory intact. Attention span, concentration and fund of knowledge appropriate. Mood and affect appropriate.  Cranial Nerves: Fundoscopic exam reveals sharp disc margins. Pupils equal, briskly reactive to light. Extraocular movements full without nystagmus. Visual fields full to confrontation. Hearing intact. Facial sensation intact. Face, tongue, palate moves normally and symmetrically.  Motor: Normal bulk and tone. Normal strength in all tested extremity muscles. Sensory.: intact to touch , pinprick , position and vibratory sensation.  Coordination: Rapid alternating movements normal in all extremities. Finger-to-nose and heel-to-shin performed accurately bilaterally. Gait and Station:  Arises from chair without difficulty. Stance is normal. Gait demonstrates normal stride length and balance . Able to heel, toe and tandem walk without difficulty.  Reflexes: 1+ and symmetric. Toes downgoing.   NIHSS  0 Modified Rankin  0  ASSESSMENT: 54 year old Caucasian lady with what appears to be silent left frontal subcortical lacunar infarct in December 2019 with multiple vascular risk factors of diabetes, hypertension, hyperlipidemia and mild obesity.  Past history of breast cancer s/p chemotherapy and lumpectomy now in remission     PLAN: I had a long d/w patient about her remote lacunar silent stroke, risk for recurrent stroke/TIAs, personally independently reviewed imaging studies and stroke evaluation results and answered questions.Continue aspirin 81 mg daily  for secondary stroke prevention and maintain strict control of hypertension with blood pressure goal below 130/90, diabetes with hemoglobin A1c goal below 6.5% and lipids with LDL cholesterol goal below 70 mg/dL. I also advised the patient to eat a healthy diet with plenty of whole grains, cereals, fruits and vegetables, exercise  regularly and maintain ideal body weight .check polysomnogram for sleep apnea as she appears to be at high risk because of mild obesity and snoring.  Greater than 50% time during this 50-minute consultation visit was spent on counseling and coordination of care about her silent lacunar infarct and discussion about stroke prevention and treatment and answering questions.  Followup in the future with my nurse practitioner Janett Billow in 6 months or call earlier if necessary. Antony Contras, MD  [facility] [address] [place], [place] [address]  Phone [phone] Fax [phone] Note: This document was prepared with digital dictation and possible smart phrase technology. Any transcriptional errors that result from this process are unintentional.

## 2019-10-30 NOTE — Patient Instructions (Signed)
I had a long d/w patient about her remote lacunar silent stroke, risk for recurrent stroke/TIAs, personally independently reviewed imaging studies and stroke evaluation results and answered questions.Continue aspirin 81 mg daily  for secondary stroke prevention and maintain strict control of hypertension with blood pressure goal below 130/90, diabetes with hemoglobin A1c goal below 6.5% and lipids with LDL cholesterol goal below 70 mg/dL. I also advised the patient to eat a healthy diet with plenty of whole grains, cereals, fruits and vegetables, exercise regularly and maintain ideal body weight .check polysomnogram for sleep apnea as she appears to be at high risk because of mild obesity and snoring.  Followup in the future with my nurse practitioner Janett Billow in 6 months or call earlier if necessary.  Stroke Prevention Some medical conditions and behaviors are associated with a higher chance of having a stroke. You can help prevent a stroke by making nutrition, lifestyle, and other changes, including managing any medical conditions you may have. What nutrition changes can be made?   Eat healthy foods. You can do this by: ? Choosing foods high in fiber, such as fresh fruits and vegetables and whole grains. ? Eating at least 5 or more servings of fruits and vegetables a day. Try to fill half of your plate at each meal with fruits and vegetables. ? Choosing lean protein foods, such as lean cuts of meat, poultry without skin, fish, tofu, beans, and nuts. ? Eating low-fat dairy products. ? Avoiding foods that are high in salt (sodium). This can help lower blood pressure. ? Avoiding foods that have saturated fat, trans fat, and cholesterol. This can help prevent high cholesterol. ? Avoiding processed and premade foods.  Follow your health care provider's specific guidelines for losing weight, controlling high blood pressure (hypertension), lowering high cholesterol, and managing diabetes. These may  include: ? Reducing your daily calorie intake. ? Limiting your daily sodium intake to 1,500 milligrams (mg). ? Using only healthy fats for cooking, such as olive oil, canola oil, or sunflower oil. ? Counting your daily carbohydrate intake. What lifestyle changes can be made?  Maintain a healthy weight. Talk to your health care provider about your ideal weight.  Get at least 30 minutes of moderate physical activity at least 5 days a week. Moderate activity includes brisk walking, biking, and swimming.  Do not use any products that contain nicotine or tobacco, such as cigarettes and e-cigarettes. If you need help quitting, ask your health care provider. It may also be helpful to avoid exposure to secondhand smoke.  Limit alcohol intake to no more than 1 drink a day for nonpregnant women and 2 drinks a day for men. One drink equals 12 oz of beer, 5 oz of wine, or 1 oz of hard liquor.  Stop any illegal drug use.  Avoid taking birth control pills. Talk to your health care provider about the risks of taking birth control pills if: ? You are over 73 years old. ? You smoke. ? You get migraines. ? You have ever had a blood clot. What other changes can be made?  Manage your cholesterol levels. ? Eating a healthy diet is important for preventing high cholesterol. If cholesterol cannot be managed through diet alone, you may also need to take medicines. ? Take any prescribed medicines to control your cholesterol as told by your health care provider.  Manage your diabetes. ? Eating a healthy diet and exercising regularly are important parts of managing your blood sugar. If your blood sugar cannot  be managed through diet and exercise, you may need to take medicines. ? Take any prescribed medicines to control your diabetes as told by your health care provider.  Control your hypertension. ? To reduce your risk of stroke, try to keep your blood pressure below 130/80. ? Eating a healthy diet and  exercising regularly are an important part of controlling your blood pressure. If your blood pressure cannot be managed through diet and exercise, you may need to take medicines. ? Take any prescribed medicines to control hypertension as told by your health care provider. ? Ask your health care provider if you should monitor your blood pressure at home. ? Have your blood pressure checked every year, even if your blood pressure is normal. Blood pressure increases with age and some medical conditions.  Get evaluated for sleep disorders (sleep apnea). Talk to your health care provider about getting a sleep evaluation if you snore a lot or have excessive sleepiness.  Take over-the-counter and prescription medicines only as told by your health care provider. Aspirin or blood thinners (antiplatelets or anticoagulants) may be recommended to reduce your risk of forming blood clots that can lead to stroke.  Make sure that any other medical conditions you have, such as atrial fibrillation or atherosclerosis, are managed. What are the warning signs of a stroke? The warning signs of a stroke can be easily remembered as BEFAST.  B is for balance. Signs include: ? Dizziness. ? Loss of balance or coordination. ? Sudden trouble walking.  E is for eyes. Signs include: ? A sudden change in vision. ? Trouble seeing.  F is for face. Signs include: ? Sudden weakness or numbness of the face. ? The face or eyelid drooping to one side.  A is for arms. Signs include: ? Sudden weakness or numbness of the arm, usually on one side of the body.  S is for speech. Signs include: ? Trouble speaking (aphasia). ? Trouble understanding.  T is for time. ? These symptoms may represent a serious problem that is an emergency. Do not wait to see if the symptoms will go away. Get medical help right away. Call your local emergency services (911 in the [place] Do not drive yourself to the hospital.  Other signs of stroke  may include: ? A sudden, severe headache with no known cause. ? Nausea or vomiting. ? Seizure. Where to find more information For more information, visit:  American Stroke Association: www.strokeassociation.org  National Stroke Association: www.stroke.org Summary  You can prevent a stroke by eating healthy, exercising, not smoking, limiting alcohol intake, and managing any medical conditions you may have.  Do not use any products that contain nicotine or tobacco, such as cigarettes and e-cigarettes. If you need help quitting, ask your health care provider. It may also be helpful to avoid exposure to secondhand smoke.  Remember BEFAST for warning signs of stroke. Get help right away if you or a loved one has any of these signs. This information is not intended to replace advice given to you by your health care provider. Make sure you discuss any questions you have with your health care provider. Document Revised: 08/04/2017 Document Reviewed: 09/27/2016 Elsevier Patient Education  2020 [employer].

## 2019-11-05 DIAGNOSIS — I471 Supraventricular tachycardia: Secondary | ICD-10-CM | POA: Diagnosis not present

## 2019-11-05 DIAGNOSIS — I472 Ventricular tachycardia: Secondary | ICD-10-CM | POA: Diagnosis not present

## 2019-11-05 DIAGNOSIS — E119 Type 2 diabetes mellitus without complications: Secondary | ICD-10-CM | POA: Diagnosis not present

## 2019-11-05 DIAGNOSIS — E1165 Type 2 diabetes mellitus with hyperglycemia: Secondary | ICD-10-CM | POA: Diagnosis not present

## 2019-11-05 DIAGNOSIS — I6782 Cerebral ischemia: Secondary | ICD-10-CM | POA: Diagnosis not present

## 2019-11-19 ENCOUNTER — Ambulatory Visit: Payer: Federal, State, Local not specified - PPO | Admitting: Cardiology

## 2019-11-19 ENCOUNTER — Encounter: Payer: Self-pay | Admitting: Cardiology

## 2019-11-19 ENCOUNTER — Other Ambulatory Visit: Payer: Self-pay

## 2019-11-19 VITALS — BP 134/81 | HR 78 | Temp 98.2°F | Ht 64.0 in | Wt 235.0 lb

## 2019-11-19 DIAGNOSIS — I10 Essential (primary) hypertension: Secondary | ICD-10-CM

## 2019-11-19 DIAGNOSIS — E119 Type 2 diabetes mellitus without complications: Secondary | ICD-10-CM

## 2019-11-19 DIAGNOSIS — E782 Mixed hyperlipidemia: Secondary | ICD-10-CM

## 2019-11-19 DIAGNOSIS — R42 Dizziness and giddiness: Secondary | ICD-10-CM | POA: Diagnosis not present

## 2019-11-19 DIAGNOSIS — I471 Supraventricular tachycardia: Secondary | ICD-10-CM | POA: Diagnosis not present

## 2019-11-19 DIAGNOSIS — I472 Ventricular tachycardia, unspecified: Secondary | ICD-10-CM

## 2019-11-19 DIAGNOSIS — Z6841 Body Mass Index (BMI) 40.0 and over, adult: Secondary | ICD-10-CM

## 2019-11-19 DIAGNOSIS — Z87891 Personal history of nicotine dependence: Secondary | ICD-10-CM

## 2019-11-19 DIAGNOSIS — Z853 Personal history of malignant neoplasm of breast: Secondary | ICD-10-CM

## 2019-11-19 MED ORDER — METOPROLOL SUCCINATE ER 25 MG PO TB24: 25.0000 mg | ORAL_TABLET | Freq: Every day | ORAL | 1 refills | Status: DC

## 2019-11-19 NOTE — Patient Instructions (Signed)
Please remember to bring in your medication bottles in at the next visit.   New Medications that were added at today's visit:  Toprol-XL 25 mg p.o. daily.  Medications that were discontinued at today's visit: None  Office will call you to have the following tests scheduled:  Patient is scheduled to have an evaluation for sleep apnea, agree. Stress test  Recommend follow up with your PCP as scheduled.

## 2019-11-19 NOTE — Progress Notes (Signed)
REASON FOR CONSULT: Ventricular tachycardia  Chief Complaint  Patient presents with  . Palpitations    REQUESTING PHYSICIAN:  Merrilee Seashore, [address] [place],  [place] [id number]  HPI  Brenda Johnson is a 54 y.o. female who presents to the office with a chief complaint of " palpitations." Patient's past medical history and cardiac risk factors include: History of breast cancer status post chemotherapy/radiation/surgery, history of CVA per MRI findings noted below, non-insulin-dependent diabetes mellitus type 2, mixed hyperlipidemia, hypertension, postmenopausal female, obesity.  Patient is referred to the office at the request of her primary care physician for evaluation of ventricular tachycardia.  Ventricular tachycardia: Patient states that in the recent past she was having symptoms of lightheaded and dizziness and during the work-up an MRI was performed.  The MRA of the head was abnormal and patient underwent neurology and cardiac work-up.  MRI of the brain with contrast noted a prior stroke findings noted below.  And cardiac work-up included 2-week monitor, echocardiogram, carotid duplex.  We requested these records during the office visit and after receiving them reviewed it with the patient summarized findings noted below.  During the 2-week monitor patient was reported to have 12 episodes of supraventricular tachycardia.  Per report, the run with the fastest interval was 5 beats in duration 897 bpm and the longest SVT episode was 9.7 seconds with an average rate of 143 bpm.  Patient also had 1 episode of nonsustained ventricular tachycardia that was 15 beats in duration with a max rate of 174 bpm.  The episode of nonsustained ventricular tachycardia was an auto triggered event which occurred on October 10, 2019 at approximately 9:16 PM.  Patient states that she does not recall exactly what she was doing but possibly either eating dinner or watching TV.  Patient  has not had any episodes of syncope or near syncopal events.  Palpitations: Patient states that she has intermittent episodes of palpitation.  They occur suddenly possibly every few days apart, last for minutes and are usually self-limited.  They are more noticeable during the evening hours prior to going to bed.  There are no worsening factors.  No associated symptoms of near syncope, syncope.  No family history of premature coronary artery disease or sudden cardiac death.  No excessive intake of caffeine no consumption of herbal supplements.  Patient denies the use of stimulant and/or new medications.  Review of systems positive for: Palpitations (see above). Currently patient denies chest pain, shortness of breath at rest or effort related symptoms, lightheadedness, dizziness, orthopnea, paroxysmal nocturnal dyspnea, lower extremity swelling, near syncope, syncopal events, hematochezia, hemoptysis, hematemesis, melanotic stools, no symptoms of amaurosis fugax, motor or sensory symptoms or dysphasia in the last 6 months.   ALLERGIES: Allergies  Allergen Reactions  . Magnesium Sulfate Rash  . Aleve [Naproxen Sodium]      MEDICATION LIST PRIOR TO VISIT: Current Outpatient Medications on File Prior to Visit  Medication Sig Dispense Refill  . aspirin EC 81 MG tablet Take by mouth.    Marland Kitchen atorvastatin (LIPITOR) 10 MG tablet Take 10 mg by mouth daily.    Marland Kitchen BIOTIN PO Take by mouth.    . cholecalciferol (VITAMIN D) 25 MCG (1000 UT) tablet Take by mouth.    . famotidine (PEPCID) 20 MG tablet Take by mouth.    Marland Kitchen FARXIGA 10 MG TABS tablet Take 10 mg by mouth daily.    Marland Kitchen letrozole (FEMARA) 2.5 MG tablet     .  levothyroxine (SYNTHROID) 75 MCG tablet Take 75 mcg by mouth daily before breakfast.    . lisinopril (PRINIVIL,ZESTRIL) 40 MG tablet Take 40 mg by mouth 2 (two) times daily.     . metFORMIN (GLUCOPHAGE) 500 MG tablet Take by mouth 2 (two) times daily with a meal.    . Multiple Vitamins-Minerals  (HAIR SKIN AND NAILS FORMULA PO) Take by mouth.    . vitamin E 400 UNIT capsule Take by mouth.     No current facility-administered medications on file prior to visit.    PAST MEDICAL HISTORY: Past Medical History:  Diagnosis Date  . Breast cancer ([place]) 2019  . Cancer ([place]) 10/2017   BREAST -CHEMO AND RADIATION  . CVA (cerebral vascular accident) [facility])    MRI of the head with contrast 09/13/2018 Per records noted small subacute cortical infarction, located in the left posterior frontal lobe.  . Diabetes ([place])   . Diabetes mellitus without complication ([place])   . High cholesterol   . Hypertension     PAST SURGICAL HISTORY: Past Surgical History:  Procedure Laterality Date  . BREAST SURGERY Left 2019   LUMPECTOMY   . TUBAL LIGATION      FAMILY HISTORY: The patient family history includes Asthma in her brother; Cancer in her father and mother; Hypertension in her brother, father, and sister; Seizures in her brother; Thyroid disease in her sister.   SOCIAL HISTORY:  The patient  reports that she quit smoking about 23 years ago. She has never used smokeless tobacco. She reports current alcohol use. She reports previous drug use.  14 ORGAN REVIEW OF SYSTEMS: CONSTITUTIONAL: No fever or significant weight loss EYES: No recent significant visual change EARS, NOSE, MOUTH, THROAT: No recent significant change in hearing CARDIOVASCULAR: See discussion in subjective/HPI RESPIRATORY: See discussion in subjective/HPI GASTROINTESTINAL: No recent complaints of abdominal pain GENITOURINARY: No recent significant change in genitourinary status MUSCULOSKELETAL: No recent significant change in musculoskeletal status INTEGUMENTARY: No recent rash NEUROLOGIC: No recent significant change in motor function PSYCHIATRIC: No recent significant change in mood ENDOCRINOLOGIC: No recent significant change in endocrine status HEMATOLOGIC/LYMPHATIC: No recent significant unexpected  bruising ALLERGIC/IMMUNOLOGIC: No recent unexplained allergic reaction  PHYSICAL EXAM: Vitals with BMI 11/19/2019 10/30/2019 07/31/2019  Height '5\' 4"'$  '5\' 4"'$  -  Weight 235 lbs 242 lbs -  BMI 36.14 43.15 -  Systolic 400 867 619  Diastolic 81 73 87  Pulse 78 76 103    CONSTITUTIONAL: Well-developed and well-nourished. No acute distress.  SKIN: Skin is warm and dry. No rash noted. No cyanosis. No pallor. No jaundice HEAD: Normocephalic and atraumatic.  EYES: No scleral icterus MOUTH/THROAT: Moist oral membranes.  NECK: No JVD present. No thyromegaly noted. No carotid bruits  LYMPHATIC: No visible cervical adenopathy.  CHEST Normal respiratory effort. No intercostal retractions  LUNGS: Clear to auscultation bilaterally.  No stridor. No wheezes. No rales.  CARDIOVASCULAR: Regular rate and rhythm, positive S1-S2, no murmurs rubs or gallops appreciated ABDOMINAL: Obese, soft, nontender, nondistended, positive bowel sounds in all 4 quadrants.  No apparent ascites.  EXTREMITIES: No peripheral edema  HEMATOLOGIC: No significant bruising NEUROLOGIC: Oriented to person, place, and time. Nonfocal. Normal muscle tone.  PSYCHIATRIC: Normal mood and affect. Normal behavior. Cooperative  CARDIAC DATABASE: EKG: 11/19/2019: Normal sinus rhythm ventricular rate of 71 bpm, poor R wave progression, low voltage in the precordial leads, without underlying ischemia or injury pattern.  Echocardiogram: 10/17/2019 at [facility]: LVEF 65 to 70%, aortic valve sclerosis without stenosis normal  diastolic function, trace MR, mild TR, RVSP 20-30 mmHg.  Stress Testing:  None  Heart Catheterization: None  Carotid duplex: 10/04/2019 at [facility]: No hemodynamics significant stenosis on either sides.  Both vertebral arteries are patent with antegrade flow.  LABORATORY DATA: External Labs: Collected: January 2021 Creatinine 0.8 mg/dL. eGFR: 79 mL/min per 1.73 m Lipid profile: Total  cholesterol 155, triglycerides 125, HDL 41, LDL 89, non-HDL 114 Hemoglobin A1c: 7.9  FINAL MEDICATION LIST END OF ENCOUNTER: Meds ordered this encounter  Medications  . metoprolol succinate (TOPROL XL) 25 MG 24 hr tablet    Sig: Take 1 tablet (25 mg total) by mouth daily. Hold if systolic blood pressure (top blood pressure number) less than 100 mmHg or heart rate less than 60 bpm (pulse).    Dispense:  90 tablet    Refill:  1    There are no discontinued medications.   Current Outpatient Medications:  .  aspirin EC 81 MG tablet, Take by mouth., Disp: , Rfl:  .  atorvastatin (LIPITOR) 10 MG tablet, Take 10 mg by mouth daily., Disp: , Rfl:  .  BIOTIN PO, Take by mouth., Disp: , Rfl:  .  cholecalciferol (VITAMIN D) 25 MCG (1000 UT) tablet, Take by mouth., Disp: , Rfl:  .  famotidine (PEPCID) 20 MG tablet, Take by mouth., Disp: , Rfl:  .  FARXIGA 10 MG TABS tablet, Take 10 mg by mouth daily., Disp: , Rfl:  .  letrozole (FEMARA) 2.5 MG tablet, , Disp: , Rfl:  .  levothyroxine (SYNTHROID) 75 MCG tablet, Take 75 mcg by mouth daily before breakfast., Disp: , Rfl:  .  lisinopril (PRINIVIL,ZESTRIL) 40 MG tablet, Take 40 mg by mouth 2 (two) times daily. , Disp: , Rfl:  .  metFORMIN (GLUCOPHAGE) 500 MG tablet, Take by mouth 2 (two) times daily with a meal., Disp: , Rfl:  .  Multiple Vitamins-Minerals (HAIR SKIN AND NAILS FORMULA PO), Take by mouth., Disp: , Rfl:  .  vitamin E 400 UNIT capsule, Take by mouth., Disp: , Rfl:  .  metoprolol succinate (TOPROL XL) 25 MG 24 hr tablet, Take 1 tablet (25 mg total) by mouth daily. Hold if systolic blood pressure (top blood pressure number) less than 100 mmHg or heart rate less than 60 bpm (pulse)., Disp: 90 tablet, Rfl: 1  IMPRESSION:    ICD-10-CM   1. Ventricular tachycardia (HCC)  I47.2 PCV MYOCARDIAL PERFUSION WITH LEXISCAN    metoprolol succinate (TOPROL XL) 25 MG 24 hr tablet  2. Paroxysmal SVT (supraventricular tachycardia) (HCC)  I47.1 EKG 12-Lead     metoprolol succinate (TOPROL XL) 25 MG 24 hr tablet  3. Dizziness  R42   4. Class 3 severe obesity due to excess calories with serious comorbidity and body mass index (BMI) of 40.0 to 44.9 in adult (HCC)  E66.01    Z68.41   5. Type 2 diabetes mellitus without complication, without long-term current use of insulin (HCC)  E11.9   6. Hx of breast cancer  Z85.3   7. Former smoker  Z87.891   46. Benign hypertension  I10   9. Mixed hyperlipidemia  E78.2      RECOMMENDATIONS: Chirsty Armistead is a 54 y.o. female whose past medical history and cardiac risk factors include: History of breast cancer status post chemotherapy/radiation/surgery, history of CVA per MRI findings noted below, non-insulin-dependent diabetes mellitus type 2, mixed hyperlipidemia, hypertension, postmenopausal female, obesity.  Nonsustained ventricular tachycardia: Asymptomatic.  Patient presents to the office at  the request of her primary care physician for evaluation of ventricular tachycardia.  She had one episode of nonsustained ventricular tachycardia of approximately 15 beats in duration at 9:16 PM on October 10, 2019.  Work-up thus far has noted preserved left ventricular systolic function without any obvious structural heart disease.  She has multiple cardiovascular risk factors as discussed above and therefore ischemia should be ruled out.  Will order Lexiscan to rule out reversible ischemia.  We will start Toprol-XL 25 mg p.o. daily.  Plan is to repeat 2-week monitor once the nuclear stress test has been completed and patient has been started on beta-blocker therapy to reevaluate underlying arrhythmic burden.  Patient also has an appointment with sleep medicine to be evaluated for sleep apnea.  I encouraged it and reemphasized the importance of it as well.  Paroxysmal supraventricular tachycardia: Initiate beta-blocker therapy.  See above  Non-insulin-dependent diabetes mellitus type 2: Currently managed per primary  team.  Hyperlipidemia, mixed:  Most recent lipid profile reviewed and discussed with the patient.  Patient is estimated 10-year risk of ASCVD is approximately 5.2%.  Currently managed by primary team.  Benign essential hypertension: . Office blood pressure is at goal.  . Medication reconciled.  . Patient is asked to keep a log of both blood pressure and pulse so that medications can be titrated based on a blood pressure trend as opposed to isolated blood pressure readings in the office. . If the blood pressure is consistently greater than 134mHg patient is asked to call the office to for medication titration sooner than the next office visit.  . Low salt diet recommended. A diet that is rich in fruits, vegetables, legumes, and low-fat dairy products and low in snacks, sweets, and meats (such as the Dietary Approaches to Stop Hypertension [DASH] diet).   History of CVA:  Patient does not recall any prior history of having a stroke.  This was discovered during the work-up of her lightheaded and dizziness on MRI.  Patient denies any prior history of paroxysmal atrial fibrillation.  Patient had a 2-week monitor at an outside facility and per report no atrial fibrillation detected during the monitoring period.  Continue to modify risk factors for secondary prevention.  Orders Placed This Encounter  Procedures  . PCV MYOCARDIAL PERFUSION WITH LEXISCAN  . EKG 12-Lead   --Continue cardiac medications as reconciled in final medication list. --Return in about 4 weeks (around 12/17/2019) for After stress test. Or sooner if needed. --Continue follow-up with your primary care physician regarding the management of your other chronic comorbid conditions.  Patient's questions and concerns were addressed to her satisfaction. She voices understanding of the instructions provided during this encounter.   This note was created using a voice recognition software as a result there may be grammatical  errors inadvertently enclosed that do not reflect the nature of this encounter. Every attempt is made to correct such errors.  SRex Kras DO, FManningCardiovascular. PTakilmaOffice: [phone]

## 2019-12-04 DIAGNOSIS — Z170 Estrogen receptor positive status [ER+]: Secondary | ICD-10-CM | POA: Diagnosis not present

## 2019-12-04 DIAGNOSIS — E119 Type 2 diabetes mellitus without complications: Secondary | ICD-10-CM | POA: Diagnosis not present

## 2019-12-04 DIAGNOSIS — E039 Hypothyroidism, unspecified: Secondary | ICD-10-CM | POA: Diagnosis not present

## 2019-12-04 DIAGNOSIS — D72810 Lymphocytopenia: Secondary | ICD-10-CM | POA: Diagnosis not present

## 2019-12-04 DIAGNOSIS — C50412 Malignant neoplasm of upper-outer quadrant of left female breast: Secondary | ICD-10-CM | POA: Diagnosis not present

## 2019-12-04 DIAGNOSIS — Z6841 Body Mass Index (BMI) 40.0 and over, adult: Secondary | ICD-10-CM | POA: Diagnosis not present

## 2019-12-04 DIAGNOSIS — I10 Essential (primary) hypertension: Secondary | ICD-10-CM | POA: Diagnosis not present

## 2019-12-04 DIAGNOSIS — Z1379 Encounter for other screening for genetic and chromosomal anomalies: Secondary | ICD-10-CM | POA: Diagnosis not present

## 2019-12-04 DIAGNOSIS — E118 Type 2 diabetes mellitus with unspecified complications: Secondary | ICD-10-CM | POA: Diagnosis not present

## 2019-12-05 DIAGNOSIS — B029 Zoster without complications: Secondary | ICD-10-CM

## 2019-12-05 HISTORY — DX: Zoster without complications: B02.9

## 2019-12-09 ENCOUNTER — Ambulatory Visit: Payer: Federal, State, Local not specified - PPO | Admitting: Neurology

## 2019-12-09 ENCOUNTER — Encounter: Payer: Self-pay | Admitting: Neurology

## 2019-12-09 ENCOUNTER — Other Ambulatory Visit: Payer: Self-pay

## 2019-12-09 VITALS — BP 147/79 | HR 69 | Ht 65.0 in | Wt 237.0 lb

## 2019-12-09 DIAGNOSIS — E669 Obesity, unspecified: Secondary | ICD-10-CM | POA: Diagnosis not present

## 2019-12-09 DIAGNOSIS — I6381 Other cerebral infarction due to occlusion or stenosis of small artery: Secondary | ICD-10-CM | POA: Diagnosis not present

## 2019-12-09 DIAGNOSIS — R0683 Snoring: Secondary | ICD-10-CM | POA: Diagnosis not present

## 2019-12-09 NOTE — Patient Instructions (Signed)
  Based on your symptoms and your exam I believe you are at risk for obstructive sleep apnea (aka OSA), and I think we should proceed with a sleep study to determine whether you do or do not have OSA and how severe it is. Even, if you have mild OSA, I may want you to consider treatment with CPAP, as treatment of even borderline or mild sleep apnea can result and improvement of symptoms such as sleep disruption, daytime sleepiness, nighttime bathroom breaks, restless leg symptoms, improvement of headache syndromes, even improved mood disorder.   Please remember, the long-term risks and ramifications of untreated moderate to severe obstructive sleep apnea are: increased Cardiovascular disease, including congestive heart failure, stroke, difficult to control hypertension, treatment resistant obesity, arrhythmias, especially irregular heartbeat commonly known as A. Fib. (atrial fibrillation); even type 2 diabetes has been linked to untreated OSA.   Sleep apnea can cause disruption of sleep and sleep deprivation in most cases, which, in turn, can cause recurrent headaches, problems with memory, mood, concentration, focus, and vigilance. Most people with untreated sleep apnea report excessive daytime sleepiness, which can affect their ability to drive. Please do not drive if you feel sleepy. Patients with sleep apnea developed difficulty initiating and maintaining sleep (aka insomnia).   Having sleep apnea may increase your risk for other sleep disorders, including involuntary behaviors sleep such as sleep terrors, sleep talking, sleepwalking.    Having sleep apnea can also increase your risk for restless leg syndrome and leg movements at night.   Please note that untreated obstructive sleep apnea may carry additional perioperative morbidity. Patients with significant obstructive sleep apnea (typically, in the moderate to severe degree) should receive, if possible, perioperative PAP (positive airway pressure)  therapy and the surgeons and particularly the anesthesiologists should be informed of the diagnosis and the severity of the sleep disordered breathing.   I will likely see you back after your sleep study to go over the test results and where to go from there. We will call you after your sleep study to advise about the results (most likely, you will hear from Kristen, my nurse) and to set up an appointment at the time, as necessary.    Our sleep lab administrative assistant will call you to schedule your sleep study and give you further instructions, regarding the check in process for the sleep study, arrival time, what to bring, when you can expect to leave after the study, etc., and to answer any other logistical questions you may have. If you don't hear back from her by about 2 weeks from now, please feel free to call her direct line at [phone] or you can call our general clinic number, or email us through My Chart.   

## 2019-12-09 NOTE — Progress Notes (Signed)
Subjective:    Patient ID: Brenda Johnson is a 54 y.o. female.  HPI     Brenda Age, MD, PhD [facility] [address].O. [place], [place] [address]  Dear [name],   I saw your patient, Brenda Johnson, upon your kind request in my sleep clinic today for initial consultation of her sleep disorder, in particular, concern for underlying obstructive sleep apnea.  The patient is unaccompanied today.  As you know, Brenda Johnson is a 54 year old right-handed woman with an underlying medical history of hypertension, hyperlipidemia, diabetes, lacunar stroke, breast cancer with status post lumpectomy, and status post radiation and chemotherapy, now on Femara, and obesity, who reports snoring and some daytime tiredness.  She has an Epworth sleepiness score of 9 out of 24, fatigue severity score of 63 out of 63.  I reviewed the office note from 10/30/2019.  Her husband has sleep apnea and uses a CPAP machine.  She herself has no family history of sleep apnea.  She has had some weight fluctuations, no major changes in her weight.  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  She lives with her husband, they have grown children.  She has a dog and 2 cats in the household, typically the pets sleep on the bed with them.  She quit smoking when she was about 54 years old.  She does not drink caffeine daily, she drinks alcohol rarely.  She is scheduled for stress test.  She has a history of palpitations and had a abnormality on a Holter monitor recently, which was ordered by her primary care physician she indicates.  She has had occasional morning headaches, attributes this to seasonal increase in pollen.  She has nocturia about 2-3 times per average night.  She denies any telltale symptoms of restless leg syndrome.  Her Past Medical History Is Significant For: Past Medical History:  Diagnosis Date  . Breast cancer ([place]) 2019  . Cancer ([place]) 10/2017   BREAST -CHEMO AND RADIATION  .  CVA (cerebral vascular accident) [facility])    MRI of the head with contrast 09/13/2018 Per records noted small subacute cortical infarction, located in the left posterior frontal lobe.  . Diabetes ([place])   . Diabetes mellitus without complication ([place])   . High cholesterol   . Hypertension     Her Past Surgical History Is Significant For: Past Surgical History:  Procedure Laterality Date  . BREAST SURGERY Left 2019   LUMPECTOMY   . TUBAL LIGATION      Her Family History Is Significant For: Family History  Problem Relation Johnson of Onset  . Cancer Mother        pancreatic, uterus/cervical  . Hypertension Father   . Cancer Father        lung -   . Hypertension Sister   . Hypertension Brother   . Asthma Brother   . Seizures Brother        mentally handicapp  . Thyroid disease Sister     Her Social History Is Significant For: Social History   Socioeconomic History  . Marital status: Married    Spouse name: Not on file  . Number of children: 2  . Years of education: Not on file  . Highest education level: Not on file  Occupational History  . Not on file  Tobacco Use  . Smoking status: Former Smoker    Quit date: 10/27/1996    Years since quitting: 23.1  . Smokeless tobacco: Never  Used  Substance and Sexual Activity  . Alcohol use: Yes    Comment: VERY RARELY  . Drug use: Not Currently  . Sexual activity: Yes  Other Topics Concern  . Not on file  Social History Narrative  . Not on file   Social Determinants of Health   Financial Resource Strain:   . Difficulty of Paying Living Expenses:   Food Insecurity:   . Worried About Charity fundraiser in the Last Year:   . Arboriculturist in the Last Year:   Transportation Needs:   . Film/video editor (Medical):   Marland Kitchen Lack of Transportation (Non-Medical):   Physical Activity:   . Days of Exercise per Week:   . Minutes of Exercise per Session:   Stress:   . Feeling of Stress :   Social Connections:   . Frequency of  Communication with Friends and Family:   . Frequency of Social Gatherings with Friends and Family:   . Attends Religious Services:   . Active Member of Clubs or Organizations:   . Attends Archivist Meetings:   Marland Kitchen Marital Status:     Her Allergies Are:  Allergies  Allergen Reactions  . Magnesium Sulfate Rash  . Aleve [Naproxen Sodium]   :   Her Current Medications Are:  Outpatient Encounter Medications as of 12/09/2019  Medication Sig  . aspirin EC 81 MG tablet Take by mouth.  Marland Kitchen atorvastatin (LIPITOR) 10 MG tablet Take 10 mg by mouth daily.  Marland Kitchen BIOTIN PO Take by mouth.  . cholecalciferol (VITAMIN D) 25 MCG (1000 UT) tablet Take by mouth.  . famotidine (PEPCID) 20 MG tablet Take by mouth.  Marland Kitchen FARXIGA 10 MG TABS tablet Take 10 mg by mouth daily.  Marland Kitchen letrozole (FEMARA) 2.5 MG tablet   . levothyroxine (SYNTHROID) 75 MCG tablet Take 75 mcg by mouth daily before breakfast.  . lisinopril (PRINIVIL,ZESTRIL) 40 MG tablet Take 40 mg by mouth 2 (two) times daily.   . metFORMIN (GLUCOPHAGE) 500 MG tablet Take by mouth 2 (two) times daily with a meal.  . metoprolol succinate (TOPROL XL) 25 MG 24 hr tablet Take 1 tablet (25 mg total) by mouth daily. Hold if systolic blood pressure (top blood pressure number) less than 100 mmHg or heart rate less than 60 bpm (pulse).  . Multiple Vitamins-Minerals (HAIR SKIN AND NAILS FORMULA PO) Take by mouth.  . vitamin E 400 UNIT capsule Take by mouth.   No facility-administered encounter medications on file as of 12/09/2019.  :  Review of Systems:  Out of a complete 14 point review of systems, all are reviewed and negative with the exception of these symptoms as listed below: Review of Systems  Neurological:       Pt presents today to discuss her sleep. Pt has never had a sleep study but does endorse snoring.  Epworth Sleepiness Scale 0= would never doze 1= slight chance of dozing 2= moderate chance of dozing 3= high chance of dozing  Sitting and  reading: 2 Watching TV: 2 Sitting inactive in a public place (ex. Theater or meeting): 1 As a passenger in a car for an hour without a break: 1 Lying down to rest in the afternoon: 1 Sitting and talking to someone: 1 Sitting quietly after lunch (no alcohol): 1 In a car, while stopped in traffic: 0 Total: 9     Objective:  Neurological Exam  Physical Exam Physical Examination:   Vitals:   12/09/19 1109  BP: (!) 147/79  Pulse: 69    General Examination: The patient is a very pleasant 54 y.o. female in no acute distress. She appears well-developed and well-nourished and well groomed.   HEENT: Normocephalic, atraumatic, pupils are equal, round and reactive to light, extraocular tracking is good without limitation to gaze excursion or nystagmus noted. Hearing is grossly intact. Face is symmetric with normal facial animation. Speech is clear with no dysarthria noted. There is no hypophonia. There is no lip, neck/head, jaw or voice tremor. Neck is supple with full range of passive and active motion. There are no carotid bruits on auscultation. Oropharynx exam reveals: mild mouth dryness, adequate dental hygiene and moderate airway crowding, due to smaller airway, redundant soft palate, tonsils not fully visualized.  Mallampati is class III.  Neck circumference is 16-5/8 inches.  She has a mild to moderate overbite.  Tongue protrudes centrally and palate elevates symmetrically.  Chest: Clear to auscultation without wheezing, rhonchi or crackles noted.  Heart: S1+S2+0, regular and normal without murmurs, rubs or gallops noted.   Abdomen: Soft, non-tender and non-distended with normal bowel sounds appreciated on auscultation.  Extremities: There is no pitting edema in the distal lower extremities bilaterally.   Skin: Warm and dry without trophic changes noted.   Musculoskeletal: exam reveals no obvious joint deformities, tenderness or joint swelling or erythema.   Neurologically:   Mental status: The patient is awake, alert and oriented in all 4 spheres. Her immediate and remote memory, attention, language skills and fund of knowledge are appropriate. There is no evidence of aphasia, agnosia, apraxia or anomia. Speech is clear with normal prosody and enunciation. Thought process is linear. Mood is normal and affect is normal.  Cranial nerves II - XII are as described above under HEENT exam.  Motor exam: Normal bulk, strength and tone is noted. There is no tremor, Romberg is negative. Fine motor skills and coordination: grossly intact.  Cerebellar testing: No dysmetria or intention tremor. There is no truncal or gait ataxia.  Sensory exam: intact to light touch in the upper and lower extremities.  Gait, station and balance: She stands easily. No veering to one side is noted. No leaning to one side is noted. Posture is Johnson-appropriate and stance is narrow based. Gait shows normal stride length and normal pace. No problems turning are noted. Tandem walk is unremarkable.                Assessment and plan:   In summary, Kyrianna Pettitt is a very pleasant 54 y.o.-year old female with an underlying medical history of hypertension, hyperlipidemia, diabetes, lacunar stroke, breast cancer with status post lumpectomy, and status post radiation and chemotherapy, now on Femara, and obesity, whose history and physical exam are concerning for obstructive sleep apnea (OSA). I had a long chat with the patient about my findings and the diagnosis of OSA, its prognosis and treatment options. We talked about medical treatments, surgical interventions and non-pharmacological approaches. I explained in particular the risks and ramifications of untreated moderate to severe OSA, especially with respect to developing cardiovascular disease down the Road, including congestive heart failure, difficult to treat hypertension, cardiac arrhythmias, or stroke. Even type 2 diabetes has, in part, been linked to  untreated OSA. Symptoms of untreated OSA include daytime sleepiness, memory problems, mood irritability and mood disorder such as depression and anxiety, lack of energy, as well as recurrent headaches, especially morning headaches. We talked about trying to maintain a healthy lifestyle in general, as well  as the importance of weight control. We also talked about the importance of good sleep hygiene. I recommended the following at this time: sleep study.   I explained the sleep test procedure to the patient and also outlined possible surgical and non-surgical treatment options of OSA, including the use of a custom-made dental device (which would require a referral to a specialist dentist or oral surgeon), upper airway surgical options, such as traditional UPPP or a novel less invasive surgical option in the form of Inspire hypoglossal nerve stimulation (which would involve a referral to an ENT surgeon). I also explained the CPAP treatment option to the patient, who indicated that she would be willing to try CPAP if the need arises. I explained the importance of being compliant with PAP treatment, not only for insurance purposes but primarily to improve Her symptoms, and for the patient's long term health benefit, including to reduce Her cardiovascular risks. I answered all her questions today and the patient was in agreement. I plan to see her back after the sleep study is completed and encouraged her to call with any interim questions, concerns, problems or updates.   Thank you very much for allowing me to participate in the care of this nice patient. If I can be of any further assistance to you please do not hesitate to talk to me.  Sincerely,   Brenda Age, MD, PhD

## 2019-12-11 ENCOUNTER — Other Ambulatory Visit: Payer: Self-pay

## 2019-12-11 ENCOUNTER — Ambulatory Visit: Payer: Federal, State, Local not specified - PPO

## 2019-12-11 DIAGNOSIS — I472 Ventricular tachycardia, unspecified: Secondary | ICD-10-CM

## 2019-12-11 DIAGNOSIS — I479 Paroxysmal tachycardia, unspecified: Secondary | ICD-10-CM | POA: Diagnosis not present

## 2019-12-17 ENCOUNTER — Encounter: Payer: Self-pay | Admitting: Cardiology

## 2019-12-17 ENCOUNTER — Ambulatory Visit: Payer: Federal, State, Local not specified - PPO | Admitting: Cardiology

## 2019-12-17 ENCOUNTER — Other Ambulatory Visit: Payer: Self-pay

## 2019-12-17 VITALS — BP 139/74 | HR 62 | Temp 98.1°F | Resp 15 | Ht 64.0 in | Wt 234.0 lb

## 2019-12-17 DIAGNOSIS — Z87891 Personal history of nicotine dependence: Secondary | ICD-10-CM

## 2019-12-17 DIAGNOSIS — E782 Mixed hyperlipidemia: Secondary | ICD-10-CM

## 2019-12-17 DIAGNOSIS — R002 Palpitations: Secondary | ICD-10-CM

## 2019-12-17 DIAGNOSIS — I471 Supraventricular tachycardia: Secondary | ICD-10-CM

## 2019-12-17 DIAGNOSIS — I10 Essential (primary) hypertension: Secondary | ICD-10-CM

## 2019-12-17 DIAGNOSIS — I472 Ventricular tachycardia: Secondary | ICD-10-CM | POA: Diagnosis not present

## 2019-12-17 DIAGNOSIS — Z853 Personal history of malignant neoplasm of breast: Secondary | ICD-10-CM

## 2019-12-17 DIAGNOSIS — I4729 Other ventricular tachycardia: Secondary | ICD-10-CM

## 2019-12-17 DIAGNOSIS — E119 Type 2 diabetes mellitus without complications: Secondary | ICD-10-CM

## 2019-12-17 DIAGNOSIS — Z6841 Body Mass Index (BMI) 40.0 and over, adult: Secondary | ICD-10-CM

## 2019-12-17 MED ORDER — METOPROLOL SUCCINATE ER 50 MG PO TB24: 50.0000 mg | ORAL_TABLET | Freq: Every morning | ORAL | 1 refills | Status: DC

## 2019-12-17 NOTE — Progress Notes (Signed)
Brenda Johnson Date of Birth: Sep 07, 1965 MRN: [id number] Primary Care Provider:Ramachandran, Mauro Kaufmann, MD  Date: 12/17/19 Last Office Visit: 11/19/2019  Chief Complaint  Patient presents with  . Follow-up    4 week  . Results    stress test    HPI  Brenda Johnson is a 54 y.o. female who presents to the office with a chief complaint of " palpitations and to review test results." Patient's past medical history and cardiac risk factors include: History of breast cancer status post chemotherapy/radiation/surgery, history of CVA per MRI findings noted below, non-insulin-dependent diabetes mellitus type 2, mixed hyperlipidemia, hypertension, postmenopausal female, obesity.    Patient was recently seen in consultation back in March 2021 for evaluation of ventricular tachycardia.   Patient was having symptoms of lightheaded and dizziness and underwent an MRI. The MRI was reported to be abnormal and patient underwent neurology and cardiac work-up. MRI of the brain with contrast noted prior stroke findings noted below. Chronic cardiac work-up prior to establishing care with myself included monitor, echo, and carotid duplex under the care of her primary care physician.  During the 2-week monitor patient was reported to have 12 episodes of supraventricular tachycardia.  Per report, the run with the fastest interval was 5 beats in duration and the longest SVT episode was 9.7 seconds with an average rate of 143 bpm.  Patient also had 1 episode of nonsustained ventricular tachycardia that was 15 beats and 5.2 seconds in duration with a max rate of 174 bpm.  The episode of nonsustained ventricular tachycardia was an auto triggered event which occurred on October 10, 2019 at approximately 9:16 PM.  Patient states that she does not recall exactly what she was doing but possibly either eating dinner or watching TV.  Patient has not had any episodes of syncope or near syncopal events.  At the last office visit patient  was started on Toprol-XL 25 mg p.o. daily. Patient states that she tolerated the medication well and had no side effects to the medication. It has also helped her blood pressure overall. However from palpitation standpoint patient states that she continues to have it. There is no significant improvement in her palpitations. She also underwent a nuclear stress test which was reported to the low risk overall. The findings were discussed in great detail with the patient and summarized below for further reference.  No family history of premature coronary artery disease or sudden cardiac death.    No excessive intake of caffeine no consumption of herbal supplements.  Patient denies the use of stimulant and/or new medications.  Review of systems positive for: Palpitations (see above). Currently patient denies chest pain, shortness of breath at rest or effort related symptoms, lightheadedness, dizziness, orthopnea, paroxysmal nocturnal dyspnea, lower extremity swelling, near syncope, syncopal events, hematochezia, hemoptysis, hematemesis, melanotic stools, no symptoms of amaurosis fugax, motor or sensory symptoms or dysphasia in the last 6 months.   ALLERGIES: Allergies  Allergen Reactions  . Magnesium Sulfate Rash  . Aleve [Naproxen Sodium]    MEDICATION LIST PRIOR TO VISIT: Current Outpatient Medications on File Prior to Visit  Medication Sig Dispense Refill  . aspirin EC 81 MG tablet Take by mouth.    Marland Kitchen atorvastatin (LIPITOR) 10 MG tablet Take 10 mg by mouth daily.    Marland Kitchen BIOTIN PO Take by mouth.    . cholecalciferol (VITAMIN D) 25 MCG (1000 UT) tablet Take by mouth.    . famotidine (PEPCID) 20 MG tablet Take by mouth.    Marland Kitchen  FARXIGA 10 MG TABS tablet Take 10 mg by mouth daily.    Marland Kitchen letrozole (FEMARA) 2.5 MG tablet     . levothyroxine (SYNTHROID) 75 MCG tablet Take 75 mcg by mouth daily before breakfast.    . lisinopril (PRINIVIL,ZESTRIL) 40 MG tablet Take 40 mg by mouth 2 (two) times daily.     .  metFORMIN (GLUCOPHAGE) 500 MG tablet Take by mouth 2 (two) times daily with a meal.    . Multiple Vitamins-Minerals (HAIR SKIN AND NAILS FORMULA PO) Take by mouth.    . vitamin E 400 UNIT capsule Take by mouth.     No current facility-administered medications on file prior to visit.    PAST MEDICAL HISTORY: Past Medical History:  Diagnosis Date  . Breast cancer ([place]) 2019  . Cancer ([place]) 10/2017   BREAST -CHEMO AND RADIATION  . CVA (cerebral vascular accident) [facility])    MRI of the head with contrast 09/13/2018 Per records noted small subacute cortical infarction, located in the left posterior frontal lobe.  . Diabetes ([place])   . Diabetes mellitus without complication ([place] [facility])   . High cholesterol   . Hypertension     PAST SURGICAL HISTORY: Past Surgical History:  Procedure Laterality Date  . BREAST SURGERY Left 2019   LUMPECTOMY   . TUBAL LIGATION      FAMILY HISTORY: The patient family history includes Asthma in her brother; Cancer in her father and mother; Hypertension in her brother, father, and sister; Seizures in her brother; Thyroid disease in her sister.   SOCIAL HISTORY:  The patient  reports that she quit smoking about 23 years ago. She has never used smokeless tobacco. She reports current alcohol use. She reports previous drug use.  14 ORGAN REVIEW OF SYSTEMS: CONSTITUTIONAL: No fever or significant weight loss EYES: No recent significant visual change EARS, NOSE, MOUTH, THROAT: No recent significant change in hearing CARDIOVASCULAR: See discussion in subjective/HPI RESPIRATORY: See discussion in subjective/HPI GASTROINTESTINAL: No recent complaints of abdominal pain GENITOURINARY: No recent significant change in genitourinary status MUSCULOSKELETAL: No recent significant change in musculoskeletal status INTEGUMENTARY: No recent rash NEUROLOGIC: No recent significant change in motor function PSYCHIATRIC: No recent significant change in mood ENDOCRINOLOGIC: No recent  significant change in endocrine status HEMATOLOGIC/LYMPHATIC: No recent significant unexpected bruising ALLERGIC/IMMUNOLOGIC: No recent unexplained allergic reaction  PHYSICAL EXAM: Vitals with BMI 12/17/2019 12/09/2019 11/19/2019  Height _0  _1  _2   Weight 234 lbs 237 lbs 235 lbs  BMI 40.15 71.69 67.89  Systolic 381 017 510  Diastolic 74 79 81  Pulse 62 69 78    CONSTITUTIONAL: Well-developed and well-nourished. No acute distress.  SKIN: Skin is warm and dry. No rash noted. No cyanosis. No pallor. No jaundice HEAD: Normocephalic and atraumatic.  EYES: No scleral icterus MOUTH/THROAT: Moist oral membranes.  NECK: No JVD present. No thyromegaly noted. No carotid bruits  LYMPHATIC: No visible cervical adenopathy.  CHEST Normal respiratory effort. No intercostal retractions  LUNGS: Clear to auscultation bilaterally.  No stridor. No wheezes. No rales.  CARDIOVASCULAR: Regular rate and rhythm, positive S1-S2, no murmurs rubs or gallops appreciated ABDOMINAL: Obese, soft, nontender, nondistended, positive bowel sounds in all 4 quadrants.  No apparent ascites.  EXTREMITIES: No peripheral edema  HEMATOLOGIC: No significant bruising NEUROLOGIC: Oriented to person, place, and time. Nonfocal. Normal muscle tone.  PSYCHIATRIC: Normal mood and affect. Normal behavior. Cooperative  CARDIAC DATABASE: EKG: 11/19/2019: Normal sinus rhythm ventricular rate of 71 bpm, poor R wave progression, low voltage  in the precordial leads, without underlying ischemia or injury pattern.  Echocardiogram: 10/17/2019 at [facility]: LVEF 65 to 70%, aortic valve sclerosis without stenosis normal diastolic function, trace MR, mild TR, RVSP 20-30 mmHg.  Stress Testing:  Lexiscan (Walking with mod Bruce) Sestamibi Stress Test 12/11/2019: Nondiagnostic ECG stress. Myocardial perfusion is normal with mild breast tissue attenuation in the inferior wall.  Stress LV EF: 68% without wall motion  abnormality.  No previous exam available for comparison. Low risk study.   Heart Catheterization: None  Carotid duplex: 10/04/2019 at [facility]: No hemodynamics significant stenosis on either sides.  Both vertebral arteries are patent with antegrade flow.  LABORATORY DATA: External Labs: Collected: January 2021 Creatinine 0.8 mg/dL. eGFR: 79 mL/min per 1.73 m Lipid profile: Total cholesterol 155, triglycerides 125, HDL 41, LDL 89, non-HDL 114 Hemoglobin A1c: 7.9  FINAL MEDICATION LIST END OF ENCOUNTER: Meds ordered this encounter  Medications  . metoprolol succinate (TOPROL XL) 50 MG 24 hr tablet    Sig: Take 1 tablet (50 mg total) by mouth in the morning. Hold if systolic blood pressure (top blood pressure number) less than 100 mmHg or heart rate less than 60 bpm (pulse).    Dispense:  90 tablet    Refill:  1    Medications Discontinued During This Encounter  Medication Reason  . metoprolol succinate (TOPROL XL) 25 MG 24 hr tablet      Current Outpatient Medications:  .  aspirin EC 81 MG tablet, Take by mouth., Disp: , Rfl:  .  atorvastatin (LIPITOR) 10 MG tablet, Take 10 mg by mouth daily., Disp: , Rfl:  .  BIOTIN PO, Take by mouth., Disp: , Rfl:  .  cholecalciferol (VITAMIN D) 25 MCG (1000 UT) tablet, Take by mouth., Disp: , Rfl:  .  famotidine (PEPCID) 20 MG tablet, Take by mouth., Disp: , Rfl:  .  FARXIGA 10 MG TABS tablet, Take 10 mg by mouth daily., Disp: , Rfl:  .  letrozole (FEMARA) 2.5 MG tablet, , Disp: , Rfl:  .  levothyroxine (SYNTHROID) 75 MCG tablet, Take 75 mcg by mouth daily before breakfast., Disp: , Rfl:  .  lisinopril (PRINIVIL,ZESTRIL) 40 MG tablet, Take 40 mg by mouth 2 (two) times daily. , Disp: , Rfl:  .  metFORMIN (GLUCOPHAGE) 500 MG tablet, Take by mouth 2 (two) times daily with a meal., Disp: , Rfl:  .  metoprolol succinate (TOPROL XL) 50 MG 24 hr tablet, Take 1 tablet (50 mg total) by mouth in the morning. Hold if systolic blood pressure (top  blood pressure number) less than 100 mmHg or heart rate less than 60 bpm (pulse)., Disp: 90 tablet, Rfl: 1 .  Multiple Vitamins-Minerals (HAIR SKIN AND NAILS FORMULA PO), Take by mouth., Disp: , Rfl:  .  vitamin E 400 UNIT capsule, Take by mouth., Disp: , Rfl:   IMPRESSION:    ICD-10-CM   1. Palpitations  R00.2 EXTERNAL ECG MONITOR (UP TO 30 DAYS) ROCT    metoprolol succinate (TOPROL XL) 50 MG 24 hr tablet  2. Paroxysmal SVT (supraventricular tachycardia) (HCC)  I47.1 EXTERNAL ECG MONITOR (UP TO 30 DAYS) ROCT    metoprolol succinate (TOPROL XL) 50 MG 24 hr tablet  3. NSVT (nonsustained ventricular tachycardia) (HCC)  I47.2 EXTERNAL ECG MONITOR (UP TO 30 DAYS) ROCT    metoprolol succinate (TOPROL XL) 50 MG 24 hr tablet  4. Class 3 severe obesity due to excess calories with serious comorbidity and body mass index (  BMI) of 40.0 to 44.9 in adult ([place])  E66.01    Z68.41   5. Type 2 diabetes mellitus without complication, without long-term current use of insulin (HCC)  E11.9   6. Hx of breast cancer  Z85.3   7. Former smoker  Z87.891   85. Benign hypertension  I10   9. Mixed hyperlipidemia  E78.2      RECOMMENDATIONS: Brenda Johnson is a 54 y.o. female whose past medical history and cardiac risk factors include: History of breast cancer status post chemotherapy/radiation/surgery, history of CVA per MRI findings noted below, non-insulin-dependent diabetes mellitus type 2, mixed hyperlipidemia, hypertension, postmenopausal female, obesity.  Nonsustained ventricular tachycardia: Asymptomatic.  During her prior cardiac work-up she had one episode of nonsustained ventricular tachycardia which was 15 beats and 5.2 seconds in duration and overall asymptomatic.    Nuclear stress test was performed to rule out underlying reversible ischemia.  The study was overall reported to be low risk.  Patient was started on Toprol-XL 25 mg p.o. daily.  Patient states that she has not had any significant change in  her symptoms.  Will increase her Toprol-XL to 50 mg p.o. daily with holding parameters.    We will repeat a 2-week mobile cardiac ambulatory telemetry to reevaluate underlying arrhythmic burden.  Patient is also following up with sleep medicine and has a sleep study scheduled in the near future as well.    Patient also has a follow-up visit with her PCP end of April during which time she is going to have her thyroid function rechecked as she is on Synthroid supplement.    Echocardiogram was performed in outside facility but report noted a preserved left ventricular systolic function.  Paroxysmal supraventricular tachycardia: Uptitrate beta-blocker therapy.  See above  Non-insulin-dependent diabetes mellitus type 2: Currently managed per primary team.  Hyperlipidemia, mixed:  Most recent lipid profile reviewed and discussed with the patient.  Currently managed by primary team.  Benign essential hypertension: . Office blood pressure is at goal.  . Medication reconciled.  . Patient is asked to keep a log of both blood pressure and pulse so that medications can be titrated based on a blood pressure trend as opposed to isolated blood pressure readings in the office. . If the blood pressure is consistently greater than 155mHg patient is asked to call the office to for medication titration sooner than the next office visit.  . Low salt diet recommended. A diet that is rich in fruits, vegetables, legumes, and low-fat dairy products and low in snacks, sweets, and meats (such as the Dietary Approaches to Stop Hypertension [DASH] diet).   History of CVA:  Patient does not recall any prior history of having a stroke.  This was discovered during the work-up of her lightheaded and dizziness on MRI.  Patient denies any prior history of paroxysmal atrial fibrillation.  Patient had a 2-week monitor at an outside facility and per report no atrial fibrillation detected during the monitoring  period.  Continue to modify risk factors for secondary prevention.  We will repeat a 2-week mobile cardiac ambulatory telemetry to reevaluate underlying arrhythmic burden.  Orders Placed This Encounter  Procedures  . EXTERNAL ECG MONITOR (UP TO 30 DAYS) ROCT   --Continue cardiac medications as reconciled in final medication list. --Return in about 6 weeks (around 01/28/2020) for Discussion of test results . Or sooner if needed. --Continue follow-up with your primary care physician regarding the management of your other chronic comorbid conditions.  Patient's questions and  concerns were addressed to her satisfaction. She voices understanding of the instructions provided during this encounter.   This note was created using a voice recognition software as a result there may be grammatical errors inadvertently enclosed that do not reflect the nature of this encounter. Every attempt is made to correct such errors.  Rex Kras, DO, [facility]. [place] Office: [phone]

## 2019-12-17 NOTE — Patient Instructions (Signed)
Please remember to bring in your medication bottles in at the next visit.   Medications that were updated at today's visit:  Toprol-XL to 50 mg p.o. every morning  Medications that were discontinued at today's visit: None  Office will call you to have the following tests scheduled:  We will monitor  Recommend follow up with your PCP as scheduled.

## 2019-12-24 ENCOUNTER — Ambulatory Visit: Payer: Federal, State, Local not specified - PPO

## 2019-12-24 ENCOUNTER — Other Ambulatory Visit: Payer: Self-pay

## 2019-12-24 DIAGNOSIS — I471 Supraventricular tachycardia: Secondary | ICD-10-CM

## 2019-12-24 DIAGNOSIS — R002 Palpitations: Secondary | ICD-10-CM

## 2019-12-24 DIAGNOSIS — I4729 Other ventricular tachycardia: Secondary | ICD-10-CM

## 2019-12-24 DIAGNOSIS — I472 Ventricular tachycardia: Secondary | ICD-10-CM

## 2019-12-29 ENCOUNTER — Ambulatory Visit (INDEPENDENT_AMBULATORY_CARE_PROVIDER_SITE_OTHER): Payer: Federal, State, Local not specified - PPO | Admitting: Neurology

## 2019-12-29 DIAGNOSIS — R0683 Snoring: Secondary | ICD-10-CM

## 2019-12-29 DIAGNOSIS — E669 Obesity, unspecified: Secondary | ICD-10-CM

## 2019-12-29 DIAGNOSIS — G4720 Circadian rhythm sleep disorder, unspecified type: Secondary | ICD-10-CM

## 2019-12-29 DIAGNOSIS — I6381 Other cerebral infarction due to occlusion or stenosis of small artery: Secondary | ICD-10-CM

## 2019-12-29 DIAGNOSIS — G4733 Obstructive sleep apnea (adult) (pediatric): Secondary | ICD-10-CM | POA: Diagnosis not present

## 2019-12-31 DIAGNOSIS — E063 Autoimmune thyroiditis: Secondary | ICD-10-CM | POA: Diagnosis not present

## 2019-12-31 DIAGNOSIS — E1165 Type 2 diabetes mellitus with hyperglycemia: Secondary | ICD-10-CM | POA: Diagnosis not present

## 2019-12-31 DIAGNOSIS — I10 Essential (primary) hypertension: Secondary | ICD-10-CM | POA: Diagnosis not present

## 2019-12-31 DIAGNOSIS — I472 Ventricular tachycardia: Secondary | ICD-10-CM | POA: Diagnosis not present

## 2019-12-31 DIAGNOSIS — E119 Type 2 diabetes mellitus without complications: Secondary | ICD-10-CM | POA: Diagnosis not present

## 2020-01-02 ENCOUNTER — Telehealth: Payer: Self-pay

## 2020-01-02 NOTE — Telephone Encounter (Signed)
I called patient to ask her to see if her monitor stickers are sticking to her. The monitor company is saying we are not getting good readings on her. She did not answer the phone I left her a VM to call back

## 2020-01-07 DIAGNOSIS — E782 Mixed hyperlipidemia: Secondary | ICD-10-CM | POA: Diagnosis not present

## 2020-01-07 DIAGNOSIS — E1129 Type 2 diabetes mellitus with other diabetic kidney complication: Secondary | ICD-10-CM | POA: Diagnosis not present

## 2020-01-07 DIAGNOSIS — E1165 Type 2 diabetes mellitus with hyperglycemia: Secondary | ICD-10-CM | POA: Diagnosis not present

## 2020-01-07 DIAGNOSIS — I10 Essential (primary) hypertension: Secondary | ICD-10-CM | POA: Diagnosis not present

## 2020-01-08 NOTE — Addendum Note (Signed)
Addended by: Star Age on: 01/08/2020 08:08 AM   Modules accepted: Orders

## 2020-01-08 NOTE — Procedures (Signed)
PATIENT'S NAME:  Brenda Johnson, Ambrosio DOB:      27-Feb-1966      MR#:    [id number]     DATE OF RECORDING: 12/29/2019 REFERRING M.D.:  Dr. Antony Contras Study Performed:   Baseline Polysomnogram HISTORY: 54 year old woman with a history of hypertension, hyperlipidemia, diabetes, lacunar stroke, breast cancer with status post lumpectomy, and status post radiation and chemotherapy, now on Femara, and obesity, who reports snoring and some daytime tiredness. The patient endorsed the Epworth Sleepiness Scale at 9 points. The patient's weight 238 pounds with a height of 65 (inches), resulting in a BMI of 39.7 kg/m2. The patient's neck circumference measured 16.5 inches.  CURRENT MEDICATIONS: Lipitor, Pepcid, Femara, Synthroid, Prinivil, Metformin, Toprol   PROCEDURE:  This is a multichannel digital polysomnogram utilizing the Somnostar 11.2 system.  Electrodes and sensors were applied and monitored per AASM Specifications.   EEG, EOG, Chin and Limb EMG, were sampled at 200 Hz.  ECG, Snore and Nasal Pressure, Thermal Airflow, Respiratory Effort, CPAP Flow and Pressure, Oximetry was sampled at 50 Hz. Digital video and audio were recorded.      BASELINE STUDY  Lights Out was at 20:50 and Lights On at 04:59.  Total recording time (TRT) was 489.5 minutes, with a total sleep time (TST) of 351.5 minutes.   The patient's sleep latency was 42.5 minutes, which is delayed. REM latency was 174 minutes, which is delayed. The sleep efficiency was 71.8% .     SLEEP ARCHITECTURE: WASO (Wake after sleep onset) was 102 minutes with moderate sleep fragmentation noted. There were 48 minutes in Stage N1, 128 minutes Stage N2, 111.5 minutes Stage N3 and 64 minutes in Stage REM.  The percentage of Stage N1 was 13.7%, which is increased, Stage N2 was 36.4%, Stage N3 was 31.7%, which is markedly increased, and Stage R (REM sleep) was 18.2%, which is near-normal. The arousals were noted as: 51 were spontaneous, 0 were associated with PLMs, 19  were associated with respiratory events.  RESPIRATORY ANALYSIS:  There were a total of 71 respiratory events:  4 obstructive apneas, 0 central apneas and 1 mixed apneas with a total of 5 apneas and an apnea index (AI) of .9 /hour. There were 66 hypopneas with a hypopnea index of 11.3 /hour. The patient also had 0 respiratory event related arousals (RERAs).      The total APNEA/HYPOPNEA INDEX (AHI) was 12.1/hour and the total RESPIRATORY DISTURBANCE INDEX was  12.1 /hour.  25 events occurred in REM sleep and 85 events in NREM. The REM AHI was  23.4 /hour, versus a non-REM AHI of 9.6. The patient spent 3 minutes of total sleep time in the supine position and 349 minutes in non-supine.. The supine AHI was 80.0 versus a non-supine AHI of 11.6.  OXYGEN SATURATION & C02:  The Wake baseline 02 saturation was 94%, with the lowest being 85%. Time spent below 89% saturation equaled 7 minutes.  PERIODIC LIMB MOVEMENTS: The patient had a total of 0 Periodic Limb Movements.  The Periodic Limb Movement (PLM) index was 0 and the PLM Arousal index was 0/hour.  Audio and video analysis did not show any abnormal or unusual movements, behaviors, phonations or vocalizations. The patient took 2 bathroom breaks. Mild intermittent snoring was noted. The EKG was in keeping with normal sinus rhythm (NSR).  Post-study, the patient indicated that sleep was worse than usual.   IMPRESSION:  1. Obstructive Sleep Apnea (OSA) 2. Dysfunctions associated with sleep stages or arousal from sleep  RECOMMENDATIONS:  1. This study demonstrates overall mild obstructive sleep apnea, moderate during REM sleep with a total AHI of 12.1/hour, REM AHI of 23.4/hour, and O2 nadir of 85%. Given the patient's medical history and sleep related complaints, treatment with positive airway pressure is recommended; this can be achieved in the form of autoPAP. Alternatively, a full-night CPAP titration study would allow optimization of therapy if  needed. Other treatment options may include avoidance of supine sleep position along with weight loss, upper airway or jaw surgery in selected patients or the use of an oral appliance in certain patients. ENT evaluation and/or consultation with a maxillofacial surgeon or dentist may be feasible in some instances.    2. Please note that untreated obstructive sleep apnea may carry additional perioperative morbidity. Patients with significant obstructive sleep apnea should receive perioperative PAP therapy and the surgeons and particularly the anesthesiologist should be informed of the diagnosis and the severity of the sleep disordered breathing. 3. This study shows sleep fragmentation and abnormal sleep stage percentages; these are nonspecific findings and per se do not signify an intrinsic sleep disorder or a cause for the patient's sleep-related symptoms. Causes include (but are not limited to) the first night effect of the sleep study, circadian rhythm disturbances, medication effect or an underlying mood disorder or medical problem.  4. The patient should be cautioned not to drive, work at heights, or operate dangerous or heavy equipment when tired or sleepy. Review and reiteration of good sleep hygiene measures should be pursued with any patient. 5. The patient will be seen in follow-up by Dr. Rexene Alberts at [facility] for discussion of the test results and further management strategies. The referring provider will be notified of the test results.  I certify that I have reviewed the entire raw data recording prior to the issuance of this report in accordance with the Standards of Accreditation of the American Academy of Sleep Medicine (AASM)   Star Age, MD, PhD Diplomat, American Board of Neurology and Sleep Medicine (Neurology and Sleep Medicine)

## 2020-01-08 NOTE — Progress Notes (Signed)
Patient referred by Dr. Leonie Man, seen by me on 12/09/19, diagnostic PSG on 12/29/19.    Please call and notify the patient that the recent sleep study did confirm the diagnosis of obstructive sleep apnea. OSA is overall mild, but moderate in REM sleep. Given her Hx of stroke, I recommend treatment for her OSA in the form of autoPAP, which means, that we don't have to bring her back for a second sleep study with CPAP, but will let her try an autoPAP machine at home, through a DME company (of her choice, or as per insurance requirement). The DME representative will educate her on how to use the machine, how to put the mask on, etc. I have placed an order in the chart. Please send referral, talk to patient, send report to referring MD. We will need a FU in sleep clinic for 10 weeks post-PAP set up, please arrange that with me or one of our NPs. Thanks,   Brenda Age, MD, PhD [facility])

## 2020-01-09 ENCOUNTER — Telehealth: Payer: Self-pay

## 2020-01-09 NOTE — Telephone Encounter (Signed)
I called pt. I advised pt that Dr. Rexene Alberts reviewed their sleep study results and found that pt does have mild to moderate osa. Dr. Rexene Alberts recommends that pt start an auto pap for treatment. I reviewed PAP compliance expectations with the pt. Pt is agreeable to starting an auto-PAP. I advised pt that an order will be sent to a DME, [facility], and [facility] will call the pt within about one week after they file with the pt's insurance. [facility] will show the pt how to use the machine, fit for masks, and troubleshoot the auto-PAP if needed. A follow up appt was made for insurance purposes with Hedwig Morton, NP on 03/31/2020 at 3 pm. Pt verbalized understanding to arrive 15 minutes early and bring their auto-PAP. A letter with all of this information in it will be mailed to the pt as a reminder. I verified with the pt that the address we have on file is correct. Pt verbalized understanding of results. Pt had no questions at this time but was encouraged to call back if questions arise. I have sent the order to [facility] and have received confirmation that they have received the order. Results sent to Dr. Leonie Man.

## 2020-01-09 NOTE — Telephone Encounter (Signed)
-----   Message from Star Age, MD sent at 01/08/2020  8:08 AM EDT ----- Patient referred by Dr. Leonie Man, seen by me on 12/09/19, diagnostic PSG on 12/29/19.    Please call and notify the patient that the recent sleep study did confirm the diagnosis of obstructive sleep apnea. OSA is overall mild, but moderate in REM sleep. Given her Hx of stroke, I recommend treatment for her OSA in the form of autoPAP, which means, that we don't have to bring her back for a second sleep study with CPAP, but will let her try an autoPAP machine at home, through a DME company (of her choice, or as per insurance requirement). The DME representative will educate her on how to use the machine, how to put the mask on, etc. I have placed an order in the chart. Please send referral, talk to patient, send report to referring MD. We will need a FU in sleep clinic for 10 weeks post-PAP set up, please arrange that with me or one of our NPs. Thanks,   Star Age, MD, PhD [facility])

## 2020-01-13 ENCOUNTER — Ambulatory Visit
Admission: EM | Admit: 2020-01-13 | Discharge: 2020-01-13 | Disposition: A | Discharge status: Home / Self Care | Payer: Federal, State, Local not specified - PPO | Attending: Physician Assistant | Admitting: Physician Assistant

## 2020-01-13 DIAGNOSIS — Z03818 Encounter for observation for suspected exposure to other biological agents ruled out: Secondary | ICD-10-CM

## 2020-01-13 DIAGNOSIS — S0086XA Insect bite (nonvenomous) of other part of head, initial encounter: Secondary | ICD-10-CM

## 2020-01-13 DIAGNOSIS — W57XXXA Bitten or stung by nonvenomous insect and other nonvenomous arthropods, initial encounter: Secondary | ICD-10-CM

## 2020-01-13 DIAGNOSIS — S0096XA Insect bite (nonvenomous) of unspecified part of head, initial encounter: Secondary | ICD-10-CM

## 2020-01-13 DIAGNOSIS — R519 Headache, unspecified: Secondary | ICD-10-CM

## 2020-01-13 DIAGNOSIS — R509 Fever, unspecified: Secondary | ICD-10-CM

## 2020-01-13 DIAGNOSIS — R197 Diarrhea, unspecified: Secondary | ICD-10-CM

## 2020-01-13 LAB — POCT URINALYSIS DIP (MANUAL ENTRY)
Bilirubin, UA: NEGATIVE
Blood, UA: NEGATIVE
Glucose, UA: 500 mg/dL — AB
Ketones, POC UA: NEGATIVE mg/dL
Leukocytes, UA: NEGATIVE
Nitrite, UA: NEGATIVE
Protein Ur, POC: NEGATIVE mg/dL
Spec Grav, UA: 1.02 (ref 1.010–1.025)
Urobilinogen, UA: 0.2 E.U./dL
pH, UA: 5.5 (ref 5.0–8.0)

## 2020-01-13 LAB — POCT FASTING CBG KUC MANUAL ENTRY: POCT Glucose (KUC): 221 mg/dL — AB (ref 70–99)

## 2020-01-13 MED ORDER — ONDANSETRON 4 MG PO TBDP: 4.0000 mg | ORAL_TABLET | Freq: Three times a day (TID) | ORAL | 0 refills | Status: DC | PRN

## 2020-01-13 MED ORDER — MUPIROCIN 2 % EX OINT: 1.0000 "application " | TOPICAL_OINTMENT | Freq: Two times a day (BID) | CUTANEOUS | 0 refills | Status: DC

## 2020-01-13 NOTE — ED Triage Notes (Signed)
Pt c/o insect bite to rt side of face above rt eye since Thursday. States started having diarrhea on Friday, developed fever, headache and weakness last night. States having lower back pain that started today and states not urinating as much as normal.

## 2020-01-13 NOTE — Discharge Instructions (Addendum)
Diarrhea/headache/fever Covid testing ordered, please quarantine until testing results return. CBG 221, not too high for after eating. Zofran as needed for nausea/vomiting. Bland diet, advance as tolerated. Keep hydrated, urine should be clear to pale yellow in color. If having severe abdominal pain, nausea/vomiting despite medicine, weakness, go to the ED for further evaluation.  Insect bite As discussed, possibly getting infected. Given currently with diarrhea, I would like to try not using oral antibiotics. Start bactroban ointment, warm compress. Avoid itching. If redness spreading, please give me a call.

## 2020-01-13 NOTE — ED Provider Notes (Signed)
[name]    CSN: [id number] Arrival date & time: 01/13/20  1109      History   Chief Complaint Chief Complaint  Patient presents with  . Insect Bite    HPI Brenda Johnson is a 54 y.o. female.   54 year old female with history of breast cancer s/p lumpectomy, chemo/radiation, currently on femara, noninsulin dependant DM  1. 5 day history of insect bite. Itching at first, now itching and painful. Swelling, redness, warmth. Had temp of 100.8 last night, responsive antipyretic.   2. 3-4 day history of COVID like symptoms. Has had diarrhea, headache, fatigue, fever. Nausea without vomiting. Denies abdominal pain. Denies URI symptoms. No shortness of breath, loss of taste/smell. Low back pain, intermittent. Urinary frequency. Denies dysuria, urgency, hematuria.   Last a1c 7.2 12/2019      Past Medical History:  Diagnosis Date  . Breast cancer ([place]) 2019  . Cancer ([place]) 10/2017   BREAST -CHEMO AND RADIATION  . CVA (cerebral vascular accident) [facility])    MRI of the head with contrast 09/13/2018 Per records noted small subacute cortical infarction, located in the left posterior frontal lobe.  . Diabetes ([place])   . Diabetes mellitus without complication ([place])   . High cholesterol   . Hypertension     Patient Active Problem List   Diagnosis Date Noted  . Encounter for monitoring aromatase inhibitor therapy 12/06/2018  . Dizziness 08/24/2018  . Hypomagnesemia 03/05/2018  . Lymphopenia 01/26/2018  . Normocytic normochromic anemia 01/26/2018  . Genetic testing 11/06/2017  . Class 3 severe obesity due to excess calories with serious comorbidity and body mass index (BMI) of 40.0 to 44.9 in adult ([place]) 10/31/2017  . Essential hypertension 10/20/2017  . Malignant neoplasm of upper-outer quadrant of left breast in female, estrogen receptor positive ([place]) 10/17/2017  . Hypothyroidism 10/27/2016  . Diabetes ([place]) 10/27/2016    Past Surgical History:  Procedure  Laterality Date  . BREAST SURGERY Left 2019   LUMPECTOMY   . TUBAL LIGATION      OB History    Gravida  2   Para  2   Term      Preterm      AB      Living  2     SAB      TAB      Ectopic      Multiple      Live Births               Home Medications    Prior to Admission medications   Medication Sig Start Date End Date Taking? Authorizing Provider  aspirin EC 81 MG tablet Take by mouth.    [provider]  atorvastatin (LIPITOR) 10 MG tablet Take 10 mg by mouth daily.    [provider]  BIOTIN PO Take by mouth.    [provider]  cholecalciferol (VITAMIN D) 25 MCG (1000 UT) tablet Take by mouth.    [provider]  famotidine (PEPCID) 20 MG tablet Take by mouth. 12/19/17   [provider]  FARXIGA 10 MG TABS tablet Take 10 mg by mouth daily. 10/02/19   [provider]  letrozole [facility]) 2.5 MG tablet  12/03/18   [provider]  levothyroxine (SYNTHROID) 75 MCG tablet Take 75 mcg by mouth daily before breakfast.    [provider]  lisinopril (PRINIVIL,ZESTRIL) 40 MG tablet Take 40 mg by mouth 2 (two) times daily.  [provider]  metFORMIN (GLUCOPHAGE) 500 MG tablet Take by mouth 2 (two) times daily with a meal.    [provider]  metoprolol succinate (TOPROL XL) 50 MG 24 hr tablet Take 1 tablet (50 mg total) by mouth in the morning. Hold if systolic blood pressure (top blood pressure number) less than 100 mmHg or heart rate less than 60 bpm (pulse). 12/17/19 03/16/20  Tolia, Sunit, DO  Multiple Vitamins-Minerals (HAIR SKIN AND NAILS FORMULA PO) Take by mouth.    [provider]  mupirocin ointment (BACTROBAN) 2 % Apply 1 application topically 2 (two) times daily. 01/13/20   Tasia Catchings, Nadra Hritz V, PA-C  ondansetron (ZOFRAN ODT) 4 MG disintegrating tablet Take 1 tablet (4 mg total) by mouth every 8 (eight) hours as needed for nausea or vomiting. 01/13/20   Tasia Catchings, Billy Turvey V, PA-C   vitamin E 400 UNIT capsule Take by mouth.    [provider]    Family History Family History  Problem Relation Age of Onset  . Cancer Mother        pancreatic, uterus/cervical  . Hypertension Father   . Cancer Father        lung -   . Hypertension Sister   . Hypertension Brother   . Asthma Brother   . Seizures Brother        mentally handicapp  . Thyroid disease Sister     Social History Social History   Tobacco Use  . Smoking status: Former Smoker    Quit date: 10/27/1996    Years since quitting: 23.2  . Smokeless tobacco: Never Used  Substance Use Topics  . Alcohol use: Yes    Comment: VERY RARELY  . Drug use: Not Currently     Allergies   Magnesium sulfate and Aleve [naproxen sodium]   Review of Systems Review of Systems  Reason unable to perform ROS: See HPI as above.     Physical Exam Triage Vital Signs ED Triage Vitals  Enc Vitals Group     BP 01/13/20 1116 130/81     Pulse Rate 01/13/20 1116 98     Resp 01/13/20 1116 18     Temp 01/13/20 1116 98.1 F (36.7 C)     Temp Source 01/13/20 1116 Oral     SpO2 01/13/20 1116 96 %     Weight --      Height --      Head Circumference --      Peak Flow --      Pain Score 01/13/20 1123 3     Pain Loc --      Pain Edu? --      Excl. in [place]? --    No data found.  Updated Vital Signs BP 130/81 (BP Location: Left Arm)   Pulse 98   Temp 98.1 F (36.7 C) (Oral) Comment: Last dose of Tylenol at 1am  Resp 18   LMP 06/27/2015   SpO2 96%   Physical Exam Constitutional:      General: She is not in acute distress.    Appearance: She is well-developed. She is not ill-appearing, toxic-appearing or diaphoretic.  HENT:     Head: Normocephalic and atraumatic.      Nose:     Right Sinus: Maxillary sinus tenderness and frontal sinus tenderness present.     Left Sinus: Maxillary sinus tenderness and frontal sinus tenderness present.  Eyes:     Conjunctiva/sclera: Conjunctivae normal.     Pupils:  Pupils are equal,  round, and reactive to light.  Cardiovascular:     Rate and Rhythm: Normal rate and regular rhythm.  Pulmonary:     Effort: Pulmonary effort is normal. No respiratory distress.     Comments: LCTAB Abdominal:     General: Bowel sounds are normal.     Palpations: Abdomen is soft.     Tenderness: There is no abdominal tenderness. There is no right CVA tenderness, left CVA tenderness, guarding or rebound.  Musculoskeletal:     Cervical back: Normal range of motion and neck supple.  Skin:    General: Skin is warm and dry.  Neurological:     Mental Status: She is alert and oriented to person, place, and time.  Psychiatric:        Behavior: Behavior normal.        Judgment: Judgment normal.      UC Treatments / Results  Labs (all labs ordered are listed, but only abnormal results are displayed) Labs Reviewed  POCT URINALYSIS DIP (MANUAL ENTRY) - Abnormal; Notable for the following components:      Result Value   Glucose, UA =500 (*)    All other components within normal limits  POCT FASTING CBG [facility] MANUAL ENTRY - Abnormal; Notable for the following components:   POCT Glucose ([facility]) 221 (*)    All other components within normal limits  NOVEL CORONAVIRUS, NAA    EKG   Radiology No results found.  Procedures Procedures (including critical care time)  Medications Ordered in UC Medications - No data to display  Initial Impression / Assessment and Plan / UC Course  I have reviewed the triage vital signs and the nursing notes.  Pertinent labs & imaging results that were available during my care of the patient were reviewed by me and considered in my medical decision making (see chart for details).    1. Diarrhea, headache, fever COVID PCR test ordered. Patient to quarantine until testing results return. No alarming signs on exam. CBG 221 nonfasting. Urine dipstick negative for infection. Abdomen nontender. Symptomatic treatment discussed.  Push fluids.   Return precautions given.  Patient expresses understanding and agrees to plan.  2. Insect bite Discussed worries for cellulitis. However, given diarrhea with relatively localized erythema, discussed may defer oral abx for now. Will do warm compress and bactroban. Continue to monitor, may need to add oral abx if symptoms worsens. Return precautions given. Patient expresses understanding and agrees to plan.  Final Clinical Impressions(s) / UC Diagnoses   Final diagnoses:  Diarrhea, unspecified type  Acute intractable headache, unspecified headache type  Fever, unspecified  Insect bite of other part of head, initial encounter    ED Prescriptions    Medication Sig Dispense Auth. Provider   mupirocin ointment (BACTROBAN) 2 % Apply 1 application topically 2 (two) times daily. 22 g Bodi Palmeri V, PA-C   ondansetron (ZOFRAN ODT) 4 MG disintegrating tablet Take 1 tablet (4 mg total) by mouth every 8 (eight) hours as needed for nausea or vomiting. 15 tablet Ok Edwards, PA-C     PDMP not reviewed this encounter.   Ok Edwards, PA-C 01/13/20 1309

## 2020-01-14 ENCOUNTER — Encounter: Payer: Self-pay | Admitting: Adult Health

## 2020-01-14 LAB — NOVEL CORONAVIRUS, NAA: SARS-CoV-2, NAA: NOT DETECTED

## 2020-01-14 LAB — SARS-COV-2, NAA 2 DAY TAT

## 2020-01-16 ENCOUNTER — Other Ambulatory Visit: Payer: Self-pay

## 2020-01-16 DIAGNOSIS — B029 Zoster without complications: Secondary | ICD-10-CM | POA: Diagnosis not present

## 2020-01-16 DIAGNOSIS — I4729 Other ventricular tachycardia: Secondary | ICD-10-CM

## 2020-01-16 DIAGNOSIS — I471 Supraventricular tachycardia: Secondary | ICD-10-CM

## 2020-01-16 DIAGNOSIS — R002 Palpitations: Secondary | ICD-10-CM

## 2020-01-16 MED ORDER — METOPROLOL SUCCINATE ER 50 MG PO TB24: 50.0000 mg | ORAL_TABLET | Freq: Every morning | ORAL | 1 refills | Status: DC

## 2020-01-17 DIAGNOSIS — B0239 Other herpes zoster eye disease: Secondary | ICD-10-CM | POA: Diagnosis not present

## 2020-01-23 DIAGNOSIS — R002 Palpitations: Secondary | ICD-10-CM | POA: Diagnosis not present

## 2020-01-31 ENCOUNTER — Ambulatory Visit: Payer: Federal, State, Local not specified - PPO | Admitting: Cardiology

## 2020-01-31 ENCOUNTER — Encounter: Payer: Self-pay | Admitting: Cardiology

## 2020-01-31 ENCOUNTER — Other Ambulatory Visit: Payer: Self-pay

## 2020-01-31 VITALS — BP 130/72 | HR 82 | Ht 64.0 in | Wt 239.0 lb

## 2020-01-31 DIAGNOSIS — I4729 Other ventricular tachycardia: Secondary | ICD-10-CM

## 2020-01-31 DIAGNOSIS — E119 Type 2 diabetes mellitus without complications: Secondary | ICD-10-CM

## 2020-01-31 DIAGNOSIS — I10 Essential (primary) hypertension: Secondary | ICD-10-CM

## 2020-01-31 DIAGNOSIS — I472 Ventricular tachycardia: Secondary | ICD-10-CM | POA: Diagnosis not present

## 2020-01-31 DIAGNOSIS — E782 Mixed hyperlipidemia: Secondary | ICD-10-CM

## 2020-01-31 DIAGNOSIS — Z6841 Body Mass Index (BMI) 40.0 and over, adult: Secondary | ICD-10-CM

## 2020-01-31 DIAGNOSIS — I471 Supraventricular tachycardia: Secondary | ICD-10-CM | POA: Diagnosis not present

## 2020-01-31 DIAGNOSIS — R002 Palpitations: Secondary | ICD-10-CM

## 2020-01-31 DIAGNOSIS — Z853 Personal history of malignant neoplasm of breast: Secondary | ICD-10-CM

## 2020-01-31 DIAGNOSIS — Z712 Person consulting for explanation of examination or test findings: Secondary | ICD-10-CM | POA: Diagnosis not present

## 2020-01-31 DIAGNOSIS — G4733 Obstructive sleep apnea (adult) (pediatric): Secondary | ICD-10-CM | POA: Diagnosis not present

## 2020-01-31 DIAGNOSIS — Z87891 Personal history of nicotine dependence: Secondary | ICD-10-CM

## 2020-01-31 NOTE — Progress Notes (Signed)
[name] Date of Birth: 1966-03-02 MRN: [id number] Primary Care Provider:Ramachandran, Mauro Kaufmann, MD  Date: 01/31/20 Last Office Visit: 12/17/2019   Chief Complaint  Patient presents with  . Palpitations  . Follow-up    HPI  Brenda Johnson is a 54 y.o. female who presents to the office with a chief complaint of " palpitations and to review test results." Patient's past medical history and cardiac risk factors include: History of breast cancer status post chemotherapy/radiation/surgery, history of CVA per MRI findings noted below, non-insulin-dependent diabetes mellitus type 2, mixed hyperlipidemia, hypertension, postmenopausal female, obesity.    Patient was recently seen in consultation back in March 2021 for evaluation of ventricular tachycardia.  Since she established care patient has undergone nuclear stress test which did not show any significant reversible ischemia and the study was noted to be low risk.  Despite starting her on Toprol-XL 25 mg p.o. daily patient continued to have symptoms of palpitation that had not improved significantly.  She was asked to increase her Toprol-XL to 50 mg p.o. daily and undergo 14-day mobile cardiac ambulatory telemetry.  She has undergone the mobile cardiac ambulatory telemetry since last office visit and results were reviewed with the patient at today.  Repeat study did not show  nonsustained ventricular tachycardia and her ventricular ectopic burden is not significant.  No family history of premature coronary artery disease or sudden cardiac death.    No excessive intake of caffeine no consumption of herbal supplements.  Patient denies the use of stimulant and/or new medications.  ALLERGIES: Allergies  Allergen Reactions  . Magnesium Sulfate Rash  . Aleve [Naproxen Sodium]    MEDICATION LIST PRIOR TO VISIT: Current Outpatient Medications on File Prior to Visit  Medication Sig Dispense Refill  . aspirin EC 81 MG tablet Take by mouth.    Marland Kitchen  atorvastatin (LIPITOR) 10 MG tablet Take 10 mg by mouth daily.    . cholecalciferol (VITAMIN D) 25 MCG (1000 UT) tablet Take by mouth.    . famotidine (PEPCID) 20 MG tablet Take by mouth.    Marland Kitchen FARXIGA 10 MG TABS tablet Take 10 mg by mouth daily.    Marland Kitchen letrozole (FEMARA) 2.5 MG tablet     . levothyroxine (SYNTHROID) 75 MCG tablet Take 75 mcg by mouth daily before breakfast.    . lisinopril (PRINIVIL,ZESTRIL) 40 MG tablet Take 40 mg by mouth 2 (two) times daily.     . metFORMIN (GLUCOPHAGE) 500 MG tablet Take by mouth 2 (two) times daily with a meal.    . metoprolol succinate (TOPROL XL) 50 MG 24 hr tablet Take 1 tablet (50 mg total) by mouth in the morning. Hold if systolic blood pressure (top blood pressure number) less than 100 mmHg or heart rate less than 60 bpm (pulse). 90 tablet 1  . Multiple Vitamins-Minerals (HAIR SKIN AND NAILS FORMULA PO) Take by mouth.    . mupirocin ointment (BACTROBAN) 2 % Apply 1 application topically 2 (two) times daily. 22 g 0  . ondansetron (ZOFRAN ODT) 4 MG disintegrating tablet Take 1 tablet (4 mg total) by mouth every 8 (eight) hours as needed for nausea or vomiting. 15 tablet 0  . vitamin E 400 UNIT capsule Take by mouth.     No current facility-administered medications on file prior to visit.    PAST MEDICAL HISTORY: Past Medical History:  Diagnosis Date  . Breast cancer ([place]) 2019  . Cancer ([place]) 10/2017   BREAST -CHEMO AND RADIATION  . CVA (cerebral vascular  accident) [facility])    MRI of the head with contrast 09/13/2018 Per records noted small subacute cortical infarction, located in the left posterior frontal lobe.  . Diabetes ([place])   . Diabetes mellitus without complication ([place])   . High cholesterol   . Hypertension   . Shingles 12/2019    PAST SURGICAL HISTORY: Past Surgical History:  Procedure Laterality Date  . BREAST SURGERY Left 2019   LUMPECTOMY   . TUBAL LIGATION      FAMILY HISTORY: The patient family history includes Asthma in her  brother; Cancer in her father and mother; Hypertension in her brother, father, and sister; Seizures in her brother; Thyroid disease in her sister.   SOCIAL HISTORY:  The patient  reports that she quit smoking about 23 years ago. She has never used smokeless tobacco. She reports current alcohol use. She reports previous drug use.  Review of Systems  Constitution: Negative for chills and fever.  HENT: Negative for hoarse voice and nosebleeds.   Eyes: Negative for discharge, double vision and pain.  Cardiovascular: Negative for chest pain, claudication, dyspnea on exertion, leg swelling, near-syncope, orthopnea, palpitations, paroxysmal nocturnal dyspnea and syncope.  Respiratory: Negative for hemoptysis and shortness of breath.   Musculoskeletal: Negative for muscle cramps and myalgias.  Gastrointestinal: Negative for abdominal pain, constipation, diarrhea, hematemesis, hematochezia, melena, nausea and vomiting.  Neurological: Negative for dizziness and light-headedness.   PHYSICAL EXAM: Vitals with BMI 01/31/2020 01/13/2020 12/17/2019  Height '5\' 4"'$  - '5\' 4"'$   Weight 239 lbs - 234 lbs  BMI 41 - 03.70  Systolic 488 891 694  Diastolic 72 81 74  Pulse 82 98 62    CONSTITUTIONAL: Well-developed and well-nourished. No acute distress.  SKIN: Skin is warm and dry. No rash noted. No cyanosis. No pallor. No jaundice HEAD: Normocephalic and atraumatic.  EYES: No scleral icterus MOUTH/THROAT: Moist oral membranes.  NECK: No JVD present. No thyromegaly noted. No carotid bruits  LYMPHATIC: No visible cervical adenopathy.  CHEST Normal respiratory effort. No intercostal retractions  LUNGS: Clear to auscultation bilaterally.  No stridor. No wheezes. No rales.  CARDIOVASCULAR: Regular rate and rhythm, positive S1-S2, no murmurs rubs or gallops appreciated ABDOMINAL: Obese, soft, nontender, nondistended, positive bowel sounds in all 4 quadrants.  No apparent ascites.  EXTREMITIES: No peripheral edema    HEMATOLOGIC: No significant bruising NEUROLOGIC: Oriented to person, place, and time. Nonfocal. Normal muscle tone.  PSYCHIATRIC: Normal mood and affect. Normal behavior. Cooperative  CARDIAC DATABASE: EKG: 11/19/2019: Normal sinus rhythm ventricular rate of 71 bpm, poor R wave progression, low voltage in the precordial leads, without underlying ischemia or injury pattern.  Echocardiogram: 10/17/2019 at [facility]: LVEF 65 to 70%, aortic valve sclerosis without stenosis normal diastolic function, trace MR, mild TR, RVSP 20-30 mmHg.  Stress Testing:  Lexiscan (Walking with mod Bruce) Sestamibi Stress Test 12/11/2019: Nondiagnostic ECG stress. Myocardial perfusion is normal with mild breast tissue attenuation in the inferior wall.  Stress LV EF: 68% without wall motion abnormality.  No previous exam available for comparison. Low risk study.   Heart Catheterization: None  Carotid duplex: 10/04/2019 at [facility]: No hemodynamics significant stenosis on either sides.  Both vertebral arteries are patent with antegrade flow.  18 - day Mobile Cardiac Ambulatory Telemetry: Enrollment Period: 12/25/2019 - 01/11/2020 Predominant rhythm normal sinus rhythm with ventricular rate ranging from 49 - 156 bpm, average heart rate 71 bpm. No pauses greater than or equal to 2.5 seconds. No atrial fibrillation detected during the  monitoring period.   Total supraventricular ectopic burden 0.01% (81 supraventricular ectopy, 3 supraventricular runs). Longest run was 6 beats in duration at 100 bpm and fastest run was 4 beats in duration at rate of 156 bpm.  Total ventricular ectopic burden 0.02% (193 ventricular ectopy, 1 ventricular pairs and 0 ventricular runs). Heart rate < 60 bpm for 10.8% of the recording. Heart rate > 100 bpm for 3.8% of the recording. 35 Patient triggered events reviewed, not associated with any significant arrhythmia and underlying rhythm was normal  sinus.  LABORATORY DATA: External Labs: Collected: January 2021 Creatinine 0.8 mg/dL. eGFR: 79 mL/min per 1.73 m Lipid profile: Total cholesterol 155, triglycerides 125, HDL 41, LDL 89, non-HDL 114 Hemoglobin A1c: 7.9  FINAL MEDICATION LIST END OF ENCOUNTER: No orders of the defined types were placed in this encounter.   Medications Discontinued During This Encounter  Medication Reason  . BIOTIN PO Duplicate     Current Outpatient Medications:  .  aspirin EC 81 MG tablet, Take by mouth., Disp: , Rfl:  .  atorvastatin (LIPITOR) 10 MG tablet, Take 10 mg by mouth daily., Disp: , Rfl:  .  cholecalciferol (VITAMIN D) 25 MCG (1000 UT) tablet, Take by mouth., Disp: , Rfl:  .  famotidine (PEPCID) 20 MG tablet, Take by mouth., Disp: , Rfl:  .  FARXIGA 10 MG TABS tablet, Take 10 mg by mouth daily., Disp: , Rfl:  .  letrozole (FEMARA) 2.5 MG tablet, , Disp: , Rfl:  .  levothyroxine (SYNTHROID) 75 MCG tablet, Take 75 mcg by mouth daily before breakfast., Disp: , Rfl:  .  lisinopril (PRINIVIL,ZESTRIL) 40 MG tablet, Take 40 mg by mouth 2 (two) times daily. , Disp: , Rfl:  .  metFORMIN (GLUCOPHAGE) 500 MG tablet, Take by mouth 2 (two) times daily with a meal., Disp: , Rfl:  .  metoprolol succinate (TOPROL XL) 50 MG 24 hr tablet, Take 1 tablet (50 mg total) by mouth in the morning. Hold if systolic blood pressure (top blood pressure number) less than 100 mmHg or heart rate less than 60 bpm (pulse)., Disp: 90 tablet, Rfl: 1 .  Multiple Vitamins-Minerals (HAIR SKIN AND NAILS FORMULA PO), Take by mouth., Disp: , Rfl:  .  mupirocin ointment (BACTROBAN) 2 %, Apply 1 application topically 2 (two) times daily., Disp: 22 g, Rfl: 0 .  ondansetron (ZOFRAN ODT) 4 MG disintegrating tablet, Take 1 tablet (4 mg total) by mouth every 8 (eight) hours as needed for nausea or vomiting., Disp: 15 tablet, Rfl: 0 .  vitamin E 400 UNIT capsule, Take by mouth., Disp: , Rfl:   IMPRESSION:    ICD-10-CM   1.  Palpitations  R00.2   2. Encounter to discuss test results  Z71.2   3. Paroxysmal SVT (supraventricular tachycardia) (HCC)  I47.1   4. NSVT (nonsustained ventricular tachycardia) (HCC)  I47.2   5. Class 3 severe obesity due to excess calories with serious comorbidity and body mass index (BMI) of 40.0 to 44.9 in adult (HCC)  E66.01    Z68.41   6. Type 2 diabetes mellitus without complication, without long-term current use of insulin (HCC)  E11.9   7. Hx of breast cancer  Z85.3   8. Former smoker  Z87.891   79. Benign hypertension  I10   10. Mixed hyperlipidemia  E78.2      RECOMMENDATIONS: Lexis Potenza is a 54 y.o. female whose past medical history and cardiac risk factors include: History of breast cancer status post  chemotherapy/radiation/surgery, history of CVA per MRI findings noted below, non-insulin-dependent diabetes mellitus type 2, mixed hyperlipidemia, hypertension, postmenopausal female, obesity.  Nonsustained ventricular tachycardia: Asymptomatic.  Patient tolerated Toprol-XL 50 mg p.o. daily well and intolerances.  Patient underwent nuclear stress test which did not show reversible ischemia.  Patient overall is doing well from a cardiovascular standpoint.  We will continue current medical therapy.    Paroxysmal supraventricular tachycardia: Continue beta-blocker therapy.  See above  Non-insulin-dependent diabetes mellitus type 2: Currently managed per primary team.  Hyperlipidemia, mixed:  Most recent lipid profile reviewed and discussed with the patient.  Currently managed by primary team.  Benign essential hypertension: . Office blood pressure is at goal.  . Medication reconciled.  . Patient is asked to keep a log of both blood pressure and pulse so that medications can be titrated based on a blood pressure trend as opposed to isolated blood pressure readings in the office. . If the blood pressure is consistently greater than 162mHg patient is asked to call the  office to for medication titration sooner than the next office visit.  . Low salt diet recommended. A diet that is rich in fruits, vegetables, legumes, and low-fat dairy products and low in snacks, sweets, and meats (such as the Dietary Approaches to Stop Hypertension [DASH] diet).   History of CVA:  Patient does not recall any prior history of having a stroke.  This was discovered during the work-up of her lightheaded and dizziness on MRI.  Patient denies any prior history of paroxysmal atrial fibrillation.  No atrial fibrillation detected during the monitoring period.  Continue to modify risk factors for secondary prevention.  No orders of the defined types were placed in this encounter.  --Continue cardiac medications as reconciled in final medication list. --Return in about 6 months (around 08/02/2020) for re-evaluation of symptoms.. Or sooner if needed. --Continue follow-up with your primary care physician regarding the management of your other chronic comorbid conditions.  Patient's questions and concerns were addressed to her satisfaction. She voices understanding of the instructions provided during this encounter.   This note was created using a voice recognition software as a result there may be grammatical errors inadvertently enclosed that do not reflect the nature of this encounter. Every attempt is made to correct such errors.  SRex Kras DO, FBuffaloCardiovascular. PSilver RidgeOffice: [phone]

## 2020-03-02 DIAGNOSIS — G4733 Obstructive sleep apnea (adult) (pediatric): Secondary | ICD-10-CM | POA: Diagnosis not present

## 2020-03-31 ENCOUNTER — Ambulatory Visit: Payer: Self-pay | Admitting: Adult Health

## 2020-04-01 DIAGNOSIS — G4733 Obstructive sleep apnea (adult) (pediatric): Secondary | ICD-10-CM | POA: Diagnosis not present

## 2020-04-17 DIAGNOSIS — I10 Essential (primary) hypertension: Secondary | ICD-10-CM | POA: Diagnosis not present

## 2020-04-17 DIAGNOSIS — E1165 Type 2 diabetes mellitus with hyperglycemia: Secondary | ICD-10-CM | POA: Diagnosis not present

## 2020-04-17 DIAGNOSIS — E1129 Type 2 diabetes mellitus with other diabetic kidney complication: Secondary | ICD-10-CM | POA: Diagnosis not present

## 2020-04-17 DIAGNOSIS — E782 Mixed hyperlipidemia: Secondary | ICD-10-CM | POA: Diagnosis not present

## 2020-04-21 NOTE — Patient Instructions (Addendum)
Please continue using your CPAP regularly. While your insurance requires that you use CPAP at least 4 hours each night on 70% of the nights, I recommend, that you not skip any nights and use it throughout the night if you can. Getting used to CPAP and staying with the treatment long term does take time and patience and discipline. Untreated obstructive sleep apnea when it is moderate to severe can have an adverse impact on cardiovascular health and raise her risk for heart disease, arrhythmias, hypertension, congestive heart failure, stroke and diabetes. Untreated obstructive sleep apnea causes sleep disruption, nonrestorative sleep, and sleep deprivation. This can have an impact on your day to day functioning and cause daytime sleepiness and impairment of cognitive function, memory loss, mood disturbance, and problems focussing. Using CPAP regularly can improve these symptoms.   Follow up in 1 year   Sleep Apnea Sleep apnea affects breathing during sleep. It causes breathing to stop for a short time or to become shallow. It can also increase the risk of:  Heart attack.  Stroke.  Being very overweight (obese).  Diabetes.  Heart failure.  Irregular heartbeat. The goal of treatment is to help you breathe normally again. What are the causes? There are three kinds of sleep apnea:  Obstructive sleep apnea. This is caused by a blocked or collapsed airway.  Central sleep apnea. This happens when the brain does not send the right signals to the muscles that control breathing.  Mixed sleep apnea. This is a combination of obstructive and central sleep apnea. The most common cause of this condition is a collapsed or blocked airway. This can happen if:  Your throat muscles are too relaxed.  Your tongue and tonsils are too large.  You are overweight.  Your airway is too small. What increases the risk?  Being overweight.  Smoking.  Having a small airway.  Being older.  Being  female.  Drinking alcohol.  Taking medicines to calm yourself (sedatives or tranquilizers).  Having family members with the condition. What are the signs or symptoms?  Trouble staying asleep.  Being sleepy or tired during the day.  Getting angry a lot.  Loud snoring.  Headaches in the morning.  Not being able to focus your mind (concentrate).  Forgetting things.  Less interest in sex.  Mood swings.  Personality changes.  Feelings of sadness (depression).  Waking up a lot during the night to pee (urinate).  Dry mouth.  Sore throat. How is this diagnosed?  Your medical history.  A physical exam.  A test that is done when you are sleeping (sleep study). The test is most often done in a sleep lab but may also be done at home. How is this treated?   Sleeping on your side.  Using a medicine to get rid of mucus in your nose (decongestant).  Avoiding the use of alcohol, medicines to help you relax, or certain pain medicines (narcotics).  Losing weight, if needed.  Changing your diet.  Not smoking.  Using a machine to open your airway while you sleep, such as: ? An oral appliance. This is a mouthpiece that shifts your lower jaw forward. ? A CPAP device. This device blows air through a mask when you breathe out (exhale). ? An EPAP device. This has valves that you put in each nostril. ? A BPAP device. This device blows air through a mask when you breathe in (inhale) and breathe out.  Having surgery if other treatments do not work. It is   important to get treatment for sleep apnea. Without treatment, it can lead to:  High blood pressure.  Coronary artery disease.  In men, not being able to have an erection (impotence).  Reduced thinking ability. Follow these instructions at home: Lifestyle  Make changes that your doctor recommends.  Eat a healthy diet.  Lose weight if needed.  Avoid alcohol, medicines to help you relax, and some pain  medicines.  Do not use any products that contain nicotine or tobacco, such as cigarettes, e-cigarettes, and chewing tobacco. If you need help quitting, ask your doctor. General instructions  Take over-the-counter and prescription medicines only as told by your doctor.  If you were given a machine to use while you sleep, use it only as told by your doctor.  If you are having surgery, make sure to tell your doctor you have sleep apnea. You may need to bring your device with you.  Keep all follow-up visits as told by your doctor. This is important. Contact a doctor if:  The machine that you were given to use during sleep bothers you or does not seem to be working.  You do not get better.  You get worse. Get help right away if:  Your chest hurts.  You have trouble breathing in enough air.  You have an uncomfortable feeling in your back, arms, or stomach.  You have trouble talking.  One side of your body feels weak.  A part of your face is hanging down. These symptoms may be an emergency. Do not wait to see if the symptoms will go away. Get medical help right away. Call your local emergency services (911 in the [place] Do not drive yourself to the hospital. Summary  This condition affects breathing during sleep.  The most common cause is a collapsed or blocked airway.  The goal of treatment is to help you breathe normally while you sleep. This information is not intended to replace advice given to you by your health care provider. Make sure you discuss any questions you have with your health care provider. Document Revised: 06/08/2018 Document Reviewed: 04/17/2018 Elsevier Patient Education  2020 [employer] Inc[employer]  

## 2020-04-21 NOTE — Progress Notes (Addendum)
PATIENT: Brenda Johnson DOB: 1966/02/20  REASON FOR VISIT: follow up HISTORY FROM: patient  Chief Complaint  Patient presents with  . Follow-up    initial CPAP f/u. Stated that she is getting used to machine  . room 1    alone      HISTORY OF PRESENT ILLNESS: Today 04/22/20 Brenda Johnson is a 54 y.o. female here today for follow up for recently diagnosed OSA on CPAP therapy. Sleep study in 12/2019 showed "overall mild obstructive sleep apnea,  moderate during REM sleep with a total AHI of 12.1/hour, REM AHI of 23.4/hour, and O2 nadir of 85%". She reports that she continues to adjust to CPAP therapy. She is doing fairly well. No specific concerns. She does feel she sleeps a little better with therapy. She is motivated to continue using CPAP.   Compliance report dated 03/22/2020 through 04/20/2020 reveals that she used CPAP 27 of the past 30 days for compliance of 90%.  She used CPAP greater than 4 hours 22 of the past 30 days for compliance of 73%.  Average usage was 6 hours and 36 minutes on days used.  Residual AHI was 2.1 on 5 to 11 cm of water and an EPR of 3.  There was no significant leak noted.   HISTORY: (copied from Dr Guadelupe Sabin note on 12/09/2019)  Dear [name],   I saw your patient, Brenda Johnson, upon your kind request in my sleep clinic today for initial consultation of her sleep disorder, in particular, concern for underlying obstructive sleep apnea.  The patient is unaccompanied today.  As you know, Ms. Chipley is a 54 year old right-handed woman with an underlying medical history of hypertension, hyperlipidemia, diabetes, lacunar stroke, breast cancer with status post lumpectomy, and status post radiation and chemotherapy, now on Femara, and obesity, who reports snoring and some daytime tiredness.  She has an Epworth sleepiness score of 9 out of 24, fatigue severity score of 63 out of 63.  I reviewed the office note from 10/30/2019.  Her husband has sleep apnea and uses a CPAP  machine.  She herself has no family history of sleep apnea.  She has had some weight fluctuations, no major changes in her weight.  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  She lives with her husband, they have grown children.  She has a dog and 2 cats in the household, typically the pets sleep on the bed with them.  She quit smoking when she was about 54 years old.  She does not drink caffeine daily, she drinks alcohol rarely.  She is scheduled for stress test.  She has a history of palpitations and had a abnormality on a Holter monitor recently, which was ordered by her primary care physician she indicates.  She has had occasional morning headaches, attributes this to seasonal increase in pollen.  She has nocturia about 2-3 times per average night.  She denies any telltale symptoms of restless leg syndrome.    REVIEW OF SYSTEMS: Out of a complete 14 system review of symptoms, the patient complains only of the following symptoms, fatigue and all other reviewed systems are negative.  ESS: 8 FSS: 53  ALLERGIES: Allergies  Allergen Reactions  . Magnesium Sulfate Rash  . Aleve [Naproxen Sodium]     HOME MEDICATIONS: Outpatient Medications Prior to Visit  Medication Sig Dispense Refill  . aspirin EC 81 MG tablet Take by mouth.    Marland Kitchen atorvastatin (LIPITOR) 10 MG tablet Take 10 mg by mouth  daily.    . cholecalciferol (VITAMIN D) 25 MCG (1000 UT) tablet Take by mouth.    . famotidine (PEPCID) 20 MG tablet Take by mouth.    Marland Kitchen FARXIGA 10 MG TABS tablet Take 10 mg by mouth daily.    Marland Kitchen letrozole (FEMARA) 2.5 MG tablet     . levothyroxine (SYNTHROID) 75 MCG tablet Take 75 mcg by mouth daily before breakfast.    . lisinopril (PRINIVIL,ZESTRIL) 40 MG tablet Take 40 mg by mouth 2 (two) times daily.     . metFORMIN (GLUCOPHAGE-XR) 500 MG 24 hr tablet Take 1,000 mg by mouth 2 (two) times daily.    . Multiple Vitamins-Minerals (HAIR SKIN AND NAILS FORMULA PO) Take by mouth.    . mupirocin ointment  (BACTROBAN) 2 % Apply 1 application topically 2 (two) times daily. 22 g 0  . vitamin E 400 UNIT capsule Take by mouth.    . metoprolol succinate (TOPROL XL) 50 MG 24 hr tablet Take 1 tablet (50 mg total) by mouth in the morning. Hold if systolic blood pressure (top blood pressure number) less than 100 mmHg or heart rate less than 60 bpm (pulse). 90 tablet 1  . metFORMIN (GLUCOPHAGE) 500 MG tablet Take by mouth 2 (two) times daily with a meal.    . ondansetron (ZOFRAN ODT) 4 MG disintegrating tablet Take 1 tablet (4 mg total) by mouth every 8 (eight) hours as needed for nausea or vomiting. 15 tablet 0   No facility-administered medications prior to visit.    PAST MEDICAL HISTORY: Past Medical History:  Diagnosis Date  . Breast cancer ([place]) 2019  . Cancer ([place]) 10/2017   BREAST -CHEMO AND RADIATION  . CVA (cerebral vascular accident) [facility])    MRI of the head with contrast 09/13/2018 Per records noted small subacute cortical infarction, located in the left posterior frontal lobe.  . Diabetes ([place])   . Diabetes mellitus without complication ([place])   . High cholesterol   . Hypertension   . Shingles 12/2019    PAST SURGICAL HISTORY: Past Surgical History:  Procedure Laterality Date  . BREAST SURGERY Left 2019   LUMPECTOMY   . TUBAL LIGATION      FAMILY HISTORY: Family History  Problem Relation Age of Onset  . Cancer Mother        pancreatic, uterus/cervical  . Hypertension Father   . Cancer Father        lung -   . Hypertension Sister   . Hypertension Brother   . Asthma Brother   . Seizures Brother        mentally handicapp  . Thyroid disease Sister     SOCIAL HISTORY: Social History   Socioeconomic History  . Marital status: Married    Spouse name: Not on file  . Number of children: 2  . Years of education: Not on file  . Highest education level: Not on file  Occupational History  . Not on file  Tobacco Use  . Smoking status: Former Smoker    Quit date: 10/27/1996      Years since quitting: 23.5  . Smokeless tobacco: Never Used  Vaping Use  . Vaping Use: Never used  Substance and Sexual Activity  . Alcohol use: Yes    Comment: VERY RARELY  . Drug use: Not Currently  . Sexual activity: Yes  Other Topics Concern  . Not on file  Social History Narrative  . Not on file   Social Determinants of Health  Financial Resource Strain:   . Difficulty of Paying Living Expenses:   Food Insecurity:   . Worried About Charity fundraiser in the Last Year:   . Arboriculturist in the Last Year:   Transportation Needs:   . Film/video editor (Medical):   Marland Kitchen Lack of Transportation (Non-Medical):   Physical Activity:   . Days of Exercise per Week:   . Minutes of Exercise per Session:   Stress:   . Feeling of Stress :   Social Connections:   . Frequency of Communication with Friends and Family:   . Frequency of Social Gatherings with Friends and Family:   . Attends Religious Services:   . Active Member of Clubs or Organizations:   . Attends Archivist Meetings:   Marland Kitchen Marital Status:   Intimate Partner Violence:   . Fear of Current or Ex-Partner:   . Emotionally Abused:   Marland Kitchen Physically Abused:   . Sexually Abused:       PHYSICAL EXAM  Vitals:   04/22/20 0830  BP: 131/80  Pulse: 85  Weight: 245 lb (111.1 kg)  Height: 5\' 4"  (1.626 m)   Body mass index is 42.05 kg/m.  Generalized: Well developed, in no acute distress  Cardiology: normal rate and rhythm, no murmur noted Respiratory: clear to auscultation bilaterally  Neurological examination  Mentation: Alert oriented to time, place, history taking. Follows all commands speech and language fluent Cranial nerve II-XII: Pupils were equal round reactive to light. Extraocular movements were full Motor: The motor testing reveals 5 over 5 strength of all 4 extremities. Good symmetric motor tone is noted throughout.  Gait and station: Gait is normal.   DIAGNOSTIC DATA (LABS, IMAGING,  TESTING) - I reviewed patient records, labs, notes, testing and imaging myself where available.  No flowsheet data found.   No results found for: WBC, HGB, HCT, MCV, PLT No results found for: NA, K, CL, CO2, GLUCOSE, BUN, CREATININE, CALCIUM, PROT, ALBUMIN, AST, ALT, ALKPHOS, BILITOT, GFRNONAA, GFRAA No results found for: CHOL, HDL, LDLCALC, LDLDIRECT, TRIG, CHOLHDL No results found for: HGBA1C No results found for: VITAMINB12 No results found for: TSH     ASSESSMENT AND PLAN 54 y.o. year old female  has a past medical history of Breast cancer ([place]) (2019), Cancer ([place]) (10/2017), CVA (cerebral vascular accident) ([place]), Diabetes ([place]), Diabetes mellitus without complication ([place]), High cholesterol, Hypertension, and Shingles (12/2019). here with     ICD-10-CM   1. OSA on CPAP  G47.33    Z99.89     Neoma Laming continues to adjust to CPAP therapy at home.  Compliance report reveals 90% daily compliance and 73% compliance with 4-hour usage.  I have discussed recommendations of using CPAP every night for greater than 4 hours each night.  I feel that fatigue will continue to improve.  She is very motivated to continue using CPAP therapy.  She will continue healthy lifestyle habits.  She will follow-up with primary care regularly.  We have discussed appropriate follow-up.  She wishes to follow-up annually at this time.  She knows that she may return earlier if needed.  She verbalizes understanding and agreement with this plan.   No orders of the defined types were placed in this encounter.    No orders of the defined types were placed in this encounter.     I spent 15 minutes with the patient. 50% of this time was spent counseling and educating patient on plan of care and medications.  Debbora Presto, FNP-C 04/22/2020, 8:43 AM [facility] [address] [place], [place] [address] [phone]  I reviewed the above note and documentation by the Nurse Practitioner  and agree with the history, exam, assessment and plan as outlined above. I was available for consultation. Star Age, MD, PhD [facility])

## 2020-04-22 ENCOUNTER — Ambulatory Visit: Payer: Federal, State, Local not specified - PPO | Admitting: Family Medicine

## 2020-04-22 ENCOUNTER — Encounter: Payer: Self-pay | Admitting: Family Medicine

## 2020-04-22 VITALS — BP 131/80 | HR 85 | Ht 64.0 in | Wt 245.0 lb

## 2020-04-22 DIAGNOSIS — G4733 Obstructive sleep apnea (adult) (pediatric): Secondary | ICD-10-CM

## 2020-04-22 DIAGNOSIS — Z9989 Dependence on other enabling machines and devices: Secondary | ICD-10-CM

## 2020-04-24 DIAGNOSIS — E1165 Type 2 diabetes mellitus with hyperglycemia: Secondary | ICD-10-CM | POA: Diagnosis not present

## 2020-04-24 DIAGNOSIS — E1129 Type 2 diabetes mellitus with other diabetic kidney complication: Secondary | ICD-10-CM | POA: Diagnosis not present

## 2020-04-24 DIAGNOSIS — Z0000 Encounter for general adult medical examination without abnormal findings: Secondary | ICD-10-CM | POA: Diagnosis not present

## 2020-04-24 DIAGNOSIS — E538 Deficiency of other specified B group vitamins: Secondary | ICD-10-CM | POA: Diagnosis not present

## 2020-04-24 DIAGNOSIS — E782 Mixed hyperlipidemia: Secondary | ICD-10-CM | POA: Diagnosis not present

## 2020-04-30 ENCOUNTER — Ambulatory Visit: Payer: Federal, State, Local not specified - PPO | Admitting: Adult Health

## 2020-05-01 DIAGNOSIS — Z780 Asymptomatic menopausal state: Secondary | ICD-10-CM | POA: Diagnosis not present

## 2020-05-01 DIAGNOSIS — Z79811 Long term (current) use of aromatase inhibitors: Secondary | ICD-10-CM | POA: Diagnosis not present

## 2020-05-01 DIAGNOSIS — Z5181 Encounter for therapeutic drug level monitoring: Secondary | ICD-10-CM | POA: Diagnosis not present

## 2020-05-02 DIAGNOSIS — G4733 Obstructive sleep apnea (adult) (pediatric): Secondary | ICD-10-CM | POA: Diagnosis not present

## 2020-06-04 DIAGNOSIS — Z6841 Body Mass Index (BMI) 40.0 and over, adult: Secondary | ICD-10-CM | POA: Diagnosis not present

## 2020-06-04 DIAGNOSIS — Z170 Estrogen receptor positive status [ER+]: Secondary | ICD-10-CM | POA: Diagnosis not present

## 2020-06-04 DIAGNOSIS — I10 Essential (primary) hypertension: Secondary | ICD-10-CM | POA: Diagnosis not present

## 2020-06-04 DIAGNOSIS — C50412 Malignant neoplasm of upper-outer quadrant of left female breast: Secondary | ICD-10-CM | POA: Diagnosis not present

## 2020-06-04 DIAGNOSIS — D72810 Lymphocytopenia: Secondary | ICD-10-CM | POA: Diagnosis not present

## 2020-06-04 DIAGNOSIS — E039 Hypothyroidism, unspecified: Secondary | ICD-10-CM | POA: Diagnosis not present

## 2020-06-04 DIAGNOSIS — Z1379 Encounter for other screening for genetic and chromosomal anomalies: Secondary | ICD-10-CM | POA: Diagnosis not present

## 2020-06-04 DIAGNOSIS — Z1231 Encounter for screening mammogram for malignant neoplasm of breast: Secondary | ICD-10-CM | POA: Diagnosis not present

## 2020-06-04 DIAGNOSIS — D649 Anemia, unspecified: Secondary | ICD-10-CM | POA: Diagnosis not present

## 2020-06-05 DIAGNOSIS — E063 Autoimmune thyroiditis: Secondary | ICD-10-CM | POA: Diagnosis not present

## 2020-06-05 DIAGNOSIS — E041 Nontoxic single thyroid nodule: Secondary | ICD-10-CM | POA: Diagnosis not present

## 2020-06-19 DIAGNOSIS — Z853 Personal history of malignant neoplasm of breast: Secondary | ICD-10-CM | POA: Diagnosis not present

## 2020-06-19 DIAGNOSIS — Z923 Personal history of irradiation: Secondary | ICD-10-CM | POA: Diagnosis not present

## 2020-06-19 DIAGNOSIS — Z9889 Other specified postprocedural states: Secondary | ICD-10-CM | POA: Diagnosis not present

## 2020-06-19 DIAGNOSIS — N6321 Unspecified lump in the left breast, upper outer quadrant: Secondary | ICD-10-CM | POA: Diagnosis not present

## 2020-06-19 DIAGNOSIS — R928 Other abnormal and inconclusive findings on diagnostic imaging of breast: Secondary | ICD-10-CM | POA: Diagnosis not present

## 2020-07-06 ENCOUNTER — Other Ambulatory Visit: Payer: Self-pay | Admitting: Cardiology

## 2020-07-06 DIAGNOSIS — I471 Supraventricular tachycardia: Secondary | ICD-10-CM

## 2020-07-06 DIAGNOSIS — R002 Palpitations: Secondary | ICD-10-CM

## 2020-07-06 DIAGNOSIS — I472 Ventricular tachycardia: Secondary | ICD-10-CM

## 2020-07-06 DIAGNOSIS — I4729 Other ventricular tachycardia: Secondary | ICD-10-CM

## 2020-07-27 ENCOUNTER — Ambulatory Visit (INDEPENDENT_AMBULATORY_CARE_PROVIDER_SITE_OTHER): Payer: Federal, State, Local not specified - PPO | Admitting: Nurse Practitioner

## 2020-07-27 ENCOUNTER — Encounter: Payer: Self-pay | Admitting: Nurse Practitioner

## 2020-07-27 ENCOUNTER — Other Ambulatory Visit: Payer: Self-pay

## 2020-07-27 VITALS — BP 138/84 | Ht 65.0 in | Wt 247.0 lb

## 2020-07-27 DIAGNOSIS — N951 Menopausal and female climacteric states: Secondary | ICD-10-CM | POA: Diagnosis not present

## 2020-07-27 DIAGNOSIS — Z205 Contact with and (suspected) exposure to viral hepatitis: Secondary | ICD-10-CM

## 2020-07-27 DIAGNOSIS — Z01419 Encounter for gynecological examination (general) (routine) without abnormal findings: Secondary | ICD-10-CM | POA: Diagnosis not present

## 2020-07-27 DIAGNOSIS — Z853 Personal history of malignant neoplasm of breast: Secondary | ICD-10-CM | POA: Diagnosis not present

## 2020-07-27 NOTE — Progress Notes (Signed)
° °  Brenda Johnson Dec 21, 1965 [id number]   History:  54 y.o. G2P2 presents for annual exam. Postmenopausal - no HRT, no bleeding. 2019 ER + left breast cancer managed with lumpectomy, radiation, chemo, and Femara. BRCA negative. Husband Hep C positive recently and being followed by GI. Complains of painful intercourse due to dryness. Hypothyroidism and diabetes managed by PCP.   Gynecologic History Patient's last menstrual period was 06/27/2015.   Contraception: post menopausal status Last Pap: 10/27/2016. Results were: normal Last mammogram: 10/2019. Results were: normal Last colonoscopy: Never Last Dexa: 04/2020. Results were: normal  Past medical history, past surgical history, family history and social history were all reviewed and documented in the EPIC chart.  ROS:  A ROS was performed and pertinent positives and negatives are included.  Exam:  Vitals:   07/27/20 1059  BP: 138/84  Weight: 247 lb (112 kg)  Height: _0  (1.651 m)   Body mass index is 41.1 kg/m.  General appearance:  Normal Thyroid:  Symmetrical, normal in size, without palpable masses or nodularity. Respiratory  Auscultation:  Clear without wheezing or rhonchi Cardiovascular  Auscultation:  Regular rate, without rubs, murmurs or gallops  Edema/varicosities:  Not grossly evident Abdominal  Soft,nontender, without masses, guarding or rebound.  Liver/spleen:  No organomegaly noted  Hernia:  None appreciated  Skin  Inspection:  Grossly normal   Breasts: Examined lying and sitting.   Right: Without masses, retractions, discharge or axillary adenopathy.   Left: Without masses, retractions, discharge or axillary adenopathy.  Well-healed lumpectomy scar with scar tissue Gentitourinary   Inguinal/mons:  Normal without inguinal adenopathy  External genitalia:  Normal  BUS/Urethra/Skene's glands:  Normal  Vagina:  Atrophic changes  Cervix:  Normal  Uterus: Unable to palpate due to body habitus but no gross  masses or tenderness  Adnexa/parametria:     Rt: Without masses or tenderness.   Lt: Without masses or tenderness.  Anus and perineum: Normal  Digital rectal exam: Normal sphincter tone without palpated masses or tenderness  Assessment/Plan:  54 y.o. G2P2 for annual exam.   Well female exam with routine gynecological exam - Education provided on SBEs, importance of preventative screenings, current guidelines, high calcium diet, regular exercise, and multivitamin daily. Labs done by oncology/PCP.   History of left breast cancer - 2019 ER+ managed by lumpectomy, radiation, chemo, and Femara.  BRCA negative.  Normal breast exam today.  Exposure to hepatitis C - Plan: Hepatitis C antibody.  Has been positive for hep C recently and is being followed by GI.  Menopausal vaginal dryness -recommend OTC Replens 2-3 times per week and coconut oil with intercourse.  Screening for cervical cancer -normal Pap history.  Will repeat at 5-year interval per guidelines.  Screening for colon cancer -has not had screening colonoscopy.  Discussed current guidelines and importance of preventative screenings.  Her husband sees Dr. Collene Mares and she would like to schedule colonoscopy with her.  Follow-up in 1 year for annual    [place], 11:17 AM 07/27/2020

## 2020-07-27 NOTE — Patient Instructions (Addendum)
Replens Coconut oil  Health Maintenance for Postmenopausal Women Menopause is a normal process in which your ability to get pregnant comes to an end. This process happens slowly over many months or years, usually between the ages of 58 and 24. Menopause is complete when you have missed your menstrual periods for 12 months. It is important to talk with your health care provider about some of the most common conditions that affect women after menopause (postmenopausal women). These include heart disease, cancer, and bone loss (osteoporosis). Adopting a healthy lifestyle and getting preventive care can help to promote your health and wellness. The actions you take can also lower your chances of developing some of these common conditions. What should I know about menopause? During menopause, you may get a number of symptoms, such as:  Hot flashes. These can be moderate or severe.  Night sweats.  Decrease in sex drive.  Mood swings.  Headaches.  Tiredness.  Irritability.  Memory problems.  Insomnia. Choosing to treat or not to treat these symptoms is a decision that you make with your health care provider. Do I need hormone replacement therapy?  Hormone replacement therapy is effective in treating symptoms that are caused by menopause, such as hot flashes and night sweats.  Hormone replacement carries certain risks, especially as you become older. If you are thinking about using estrogen or estrogen with progestin, discuss the benefits and risks with your health care provider. What is my risk for heart disease and stroke? The risk of heart disease, heart attack, and stroke increases as you age. One of the causes may be a change in the body's hormones during menopause. This can affect how your body uses dietary fats, triglycerides, and cholesterol. Heart attack and stroke are medical emergencies. There are many things that you can do to help prevent heart disease and stroke. Watch your  blood pressure  High blood pressure causes heart disease and increases the risk of stroke. This is more likely to develop in people who have high blood pressure readings, are of African descent, or are overweight.  Have your blood pressure checked: ? Every 3-5 years if you are 62-5 years of age. ? Every year if you are 23 years old or older. Eat a healthy diet   Eat a diet that includes plenty of vegetables, fruits, low-fat dairy products, and lean protein.  Do not eat a lot of foods that are high in solid fats, added sugars, or sodium. Get regular exercise Get regular exercise. This is one of the most important things you can do for your health. Most adults should:  Try to exercise for at least 150 minutes each week. The exercise should increase your heart rate and make you sweat (moderate-intensity exercise).  Try to do strengthening exercises at least twice each week. Do these in addition to the moderate-intensity exercise.  Spend less time sitting. Even light physical activity can be beneficial. Other tips  Work with your health care provider to achieve or maintain a healthy weight.  Do not use any products that contain nicotine or tobacco, such as cigarettes, e-cigarettes, and chewing tobacco. If you need help quitting, ask your health care provider.  Know your numbers. Ask your health care provider to check your cholesterol and your blood sugar (glucose). Continue to have your blood tested as directed by your health care provider. Do I need screening for cancer? Depending on your health history and family history, you may need to have cancer screening at different stages  of your life. This may include screening for:  Breast cancer.  Cervical cancer.  Lung cancer.  Colorectal cancer. What is my risk for osteoporosis? After menopause, you may be at increased risk for osteoporosis. Osteoporosis is a condition in which bone destruction happens more quickly than new bone  creation. To help prevent osteoporosis or the bone fractures that can happen because of osteoporosis, you may take the following actions:  If you are 19-57 years old, get at least 1,000 mg of calcium and at least 600 mg of vitamin D per day.  If you are older than age 1 but younger than age 25, get at least 1,200 mg of calcium and at least 600 mg of vitamin D per day.  If you are older than age 32, get at least 1,200 mg of calcium and at least 800 mg of vitamin D per day. Smoking and drinking excessive alcohol increase the risk of osteoporosis. Eat foods that are rich in calcium and vitamin D, and do weight-bearing exercises several times each week as directed by your health care provider. How does menopause affect my mental health? Depression may occur at any age, but it is more common as you become older. Common symptoms of depression include:  Low or sad mood.  Changes in sleep patterns.  Changes in appetite or eating patterns.  Feeling an overall lack of motivation or enjoyment of activities that you previously enjoyed.  Frequent crying spells. Talk with your health care provider if you think that you are experiencing depression. General instructions See your health care provider for regular wellness exams and vaccines. This may include:  Scheduling regular health, dental, and eye exams.  Getting and maintaining your vaccines. These include: ? Influenza vaccine. Get this vaccine each year before the flu season begins. ? Pneumonia vaccine. ? Shingles vaccine. ? Tetanus, diphtheria, and pertussis (Tdap) booster vaccine. Your health care provider may also recommend other immunizations. Tell your health care provider if you have ever been abused or do not feel safe at home. Summary  Menopause is a normal process in which your ability to get pregnant comes to an end.  This condition causes hot flashes, night sweats, decreased interest in sex, mood swings, headaches, or lack of  sleep.  Treatment for this condition may include hormone replacement therapy.  Take actions to keep yourself healthy, including exercising regularly, eating a healthy diet, watching your weight, and checking your blood pressure and blood sugar levels.  Get screened for cancer and depression. Make sure that you are up to date with all your vaccines. This information is not intended to replace advice given to you by your health care provider. Make sure you discuss any questions you have with your health care provider. Document Revised: 08/15/2018 Document Reviewed: 08/15/2018 Elsevier Patient Education  2020 [employer].

## 2020-07-28 LAB — HEPATITIS C ANTIBODY
Hepatitis C Ab: NONREACTIVE
SIGNAL TO CUT-OFF: 0.01 (ref <1.00)

## 2020-07-29 ENCOUNTER — Other Ambulatory Visit: Payer: Self-pay | Admitting: Nurse Practitioner

## 2020-07-29 DIAGNOSIS — Z205 Contact with and (suspected) exposure to viral hepatitis: Secondary | ICD-10-CM

## 2020-08-03 ENCOUNTER — Ambulatory Visit: Payer: Federal, State, Local not specified - PPO | Admitting: Cardiology

## 2020-08-03 ENCOUNTER — Encounter: Payer: Self-pay | Admitting: Cardiology

## 2020-08-03 ENCOUNTER — Other Ambulatory Visit: Payer: Self-pay

## 2020-08-03 VITALS — BP 168/84 | HR 83 | Ht 65.0 in | Wt 246.0 lb

## 2020-08-03 DIAGNOSIS — R002 Palpitations: Secondary | ICD-10-CM

## 2020-08-03 DIAGNOSIS — I471 Supraventricular tachycardia: Secondary | ICD-10-CM

## 2020-08-03 DIAGNOSIS — Z87891 Personal history of nicotine dependence: Secondary | ICD-10-CM

## 2020-08-03 DIAGNOSIS — Z6841 Body Mass Index (BMI) 40.0 and over, adult: Secondary | ICD-10-CM

## 2020-08-03 DIAGNOSIS — E782 Mixed hyperlipidemia: Secondary | ICD-10-CM

## 2020-08-03 DIAGNOSIS — E119 Type 2 diabetes mellitus without complications: Secondary | ICD-10-CM

## 2020-08-03 DIAGNOSIS — I472 Ventricular tachycardia: Secondary | ICD-10-CM | POA: Diagnosis not present

## 2020-08-03 DIAGNOSIS — Z853 Personal history of malignant neoplasm of breast: Secondary | ICD-10-CM

## 2020-08-03 DIAGNOSIS — I4729 Other ventricular tachycardia: Secondary | ICD-10-CM

## 2020-08-03 DIAGNOSIS — I10 Essential (primary) hypertension: Secondary | ICD-10-CM | POA: Diagnosis not present

## 2020-08-03 NOTE — Progress Notes (Signed)
Brenda Johnson Date of Birth: 13-Dec-1965 MRN: [id number] Primary Care Provider:Ramachandran, Mauro Kaufmann, MD  Date: 08/03/20 Last Office Visit: 01/31/2020  Chief Complaint  Patient presents with  . Palpitations  . Follow-up    HPI  Brenda Johnson is a 54 y.o. female who presents to the office with a chief complaint of " 31-month follow-up for palpitations." Patient's past medical history and cardiac risk factors include: History of breast cancer status post chemotherapy/radiation/surgery, history of CVA per MRI findings noted below, non-insulin-dependent diabetes mellitus type 2, mixed hyperlipidemia, hypertension, postmenopausal female, obesity.    Patient was recently seen in consultation back in March 2021 for evaluation of ventricular tachycardia.  She had an outside monitor performed which noted episodes of nonsustained ventricular tachycardia and as result was referred to cardiology for further evaluation and management.  Since establishing care patient has been started on Toprol-XL and undergone ischemic evaluation.  At last office visit Toprol-XL dose was increased for symptom management and is now here for 42-month follow-up.  Over the last 6 months patient states that her symptoms of palpitations are very well controlled and she may have had no more than 3 episodes lasting few seconds.  She denies any chest pain or heart failure symptoms.  No hospitalizations or urgent care visits for cardiovascular symptoms since last encounter.  No family history of premature coronary artery disease or sudden cardiac death.    ALLERGIES: Allergies  Allergen Reactions  . Aleve [Naproxen Sodium] Anaphylaxis  . Magnesium Sulfate Rash   MEDICATION LIST PRIOR TO VISIT: Current Outpatient Medications on File Prior to Visit  Medication Sig Dispense Refill  . aspirin EC 81 MG tablet Take by mouth.    Marland Kitchen atorvastatin (LIPITOR) 10 MG tablet Take 10 mg by mouth daily.    . cholecalciferol (VITAMIN D) 25 MCG (1000  UT) tablet Take by mouth.    . famotidine (PEPCID) 20 MG tablet Take by mouth.    . letrozole (FEMARA) 2.5 MG tablet     . levothyroxine (SYNTHROID) 75 MCG tablet Take 75 mcg by mouth daily before breakfast.    . lisinopril (PRINIVIL,ZESTRIL) 40 MG tablet Take 40 mg by mouth 2 (two) times daily.     . metFORMIN (GLUCOPHAGE-XR) 500 MG 24 hr tablet Take 1,000 mg by mouth 2 (two) times daily.    . metoprolol succinate (TOPROL-XL) 50 MG 24 hr tablet TAKE 1 TAB EVERY MORNING *HOLD IF SYSTOLIC BP (TOP #) IS LESS THAN 100 OR PULSE LESS THAN 60 90 tablet 1  . vitamin E 400 UNIT capsule Take by mouth.    . mupirocin ointment (BACTROBAN) 2 % Apply 1 application topically 2 (two) times daily. 22 g 0   No current facility-administered medications on file prior to visit.    PAST MEDICAL HISTORY: Past Medical History:  Diagnosis Date  . Breast cancer ([place]) 2019  . Cancer ([place]) 10/2017   BREAST -CHEMO AND RADIATION  . CVA (cerebral vascular accident) [facility])    MRI of the head with contrast 09/13/2018 Per records noted small subacute cortical infarction, located in the left posterior frontal lobe.  . Diabetes ([place])   . Diabetes mellitus without complication ([place])   . High cholesterol   . Hypertension   . Shingles 12/2019    PAST SURGICAL HISTORY: Past Surgical History:  Procedure Laterality Date  . BREAST SURGERY Left 2019   LUMPECTOMY   . TUBAL LIGATION      FAMILY HISTORY: The patient family history includes Asthma in her  brother; Cancer in her father and mother; Hypertension in her brother, father, and sister; Seizures in her brother; Thyroid disease in her sister.   SOCIAL HISTORY:  The patient  reports that she quit smoking about 23 years ago. She has never used smokeless tobacco. She reports current alcohol use. She reports that she does not use drugs.  Review of Systems  Constitutional: Negative for chills and fever.  HENT: Negative for hoarse voice and nosebleeds.   Eyes: Negative  for discharge, double vision and pain.  Cardiovascular: Negative for chest pain, claudication, dyspnea on exertion, leg swelling, near-syncope, orthopnea, palpitations, paroxysmal nocturnal dyspnea and syncope.  Respiratory: Negative for hemoptysis and shortness of breath.   Musculoskeletal: Negative for muscle cramps and myalgias.  Gastrointestinal: Negative for abdominal pain, constipation, diarrhea, hematemesis, hematochezia, melena, nausea and vomiting.  Neurological: Negative for dizziness and light-headedness.   PHYSICAL EXAM: Vitals with BMI 08/03/2020 07/27/2020 04/22/2020  Height 5\' 5"  5\' 5"  5\' 4"   Weight 246 lbs 247 lbs 245 lbs  BMI 40.94 41.9 62.22  Systolic 979 892 119  Diastolic 84 84 80  Pulse 83 - 85    CONSTITUTIONAL: Well-developed and well-nourished. No acute distress.  SKIN: Skin is warm and dry. No rash noted. No cyanosis. No pallor. No jaundice HEAD: Normocephalic and atraumatic.  EYES: No scleral icterus MOUTH/THROAT: Moist oral membranes.  NECK: No JVD present. No thyromegaly noted. No carotid bruits  LYMPHATIC: No visible cervical adenopathy.  CHEST Normal respiratory effort. No intercostal retractions  LUNGS: Clear to auscultation bilaterally.  No stridor. No wheezes. No rales.  CARDIOVASCULAR: Regular rate and rhythm, positive S1-S2, no murmurs rubs or gallops appreciated ABDOMINAL: Obese, soft, nontender, nondistended, positive bowel sounds in all 4 quadrants.  No apparent ascites.  EXTREMITIES: No peripheral edema  HEMATOLOGIC: No significant bruising NEUROLOGIC: Oriented to person, place, and time. Nonfocal. Normal muscle tone.  PSYCHIATRIC: Normal mood and affect. Normal behavior. Cooperative  CARDIAC DATABASE: EKG: 08/03/2020: Normal sinus rhythm, 72 bpm, poor R wave progression, without underlying injury pattern.   Echocardiogram: 10/17/2019 at [facility]: LVEF 65 to 70%, aortic valve sclerosis without stenosis normal diastolic  function, trace MR, mild TR, RVSP 20-30 mmHg.  Stress Testing:  Lexiscan (Walking with mod Bruce) Sestamibi Stress Test 12/11/2019: Nondiagnostic ECG stress. Myocardial perfusion is normal with mild breast tissue attenuation in the inferior wall.  Stress LV EF: 68% without wall motion abnormality.  No previous exam available for comparison. Low risk study.   Heart Catheterization: None  Carotid duplex: 10/04/2019 at [facility]: No hemodynamics significant stenosis on either sides.  Both vertebral arteries are patent with antegrade flow.  18 - day Mobile Cardiac Ambulatory Telemetry: Enrollment Period: 12/25/2019 - 01/11/2020 Predominant rhythm normal sinus rhythm with ventricular rate ranging from 49 - 156 bpm, average heart rate 71 bpm. No pauses greater than or equal to 2.5 seconds. No atrial fibrillation detected during the monitoring period.   Total supraventricular ectopic burden 0.01% (81 supraventricular ectopy, 3 supraventricular runs). Longest run was 6 beats in duration at 100 bpm and fastest run was 4 beats in duration at rate of 156 bpm.  Total ventricular ectopic burden 0.02% (193 ventricular ectopy, 1 ventricular pairs and 0 ventricular runs). Heart rate < 60 bpm for 10.8% of the recording. Heart rate > 100 bpm for 3.8% of the recording. 35 Patient triggered events reviewed, not associated with any significant arrhythmia and underlying rhythm was normal sinus.  LABORATORY DATA: External Labs: Collected: January 2021 Creatinine  0.8 mg/dL. eGFR: 79 mL/min per 1.73 m Lipid profile: Total cholesterol 155, triglycerides 125, HDL 41, LDL 89, non-HDL 114 Hemoglobin A1c: 7.9  FINAL MEDICATION LIST END OF ENCOUNTER: No orders of the defined types were placed in this encounter.   Medications Discontinued During This Encounter  Medication Reason  . FARXIGA 10 MG TABS tablet Patient Preference     Current Outpatient Medications:  .  aspirin EC 81 MG tablet, Take by  mouth., Disp: , Rfl:  .  atorvastatin (LIPITOR) 10 MG tablet, Take 10 mg by mouth daily., Disp: , Rfl:  .  cholecalciferol (VITAMIN D) 25 MCG (1000 UT) tablet, Take by mouth., Disp: , Rfl:  .  famotidine (PEPCID) 20 MG tablet, Take by mouth., Disp: , Rfl:  .  letrozole (FEMARA) 2.5 MG tablet, , Disp: , Rfl:  .  levothyroxine (SYNTHROID) 75 MCG tablet, Take 75 mcg by mouth daily before breakfast., Disp: , Rfl:  .  lisinopril (PRINIVIL,ZESTRIL) 40 MG tablet, Take 40 mg by mouth 2 (two) times daily. , Disp: , Rfl:  .  metFORMIN (GLUCOPHAGE-XR) 500 MG 24 hr tablet, Take 1,000 mg by mouth 2 (two) times daily., Disp: , Rfl:  .  metoprolol succinate (TOPROL-XL) 50 MG 24 hr tablet, TAKE 1 TAB EVERY MORNING *HOLD IF SYSTOLIC BP (TOP #) IS LESS THAN 100 OR PULSE LESS THAN 60, Disp: 90 tablet, Rfl: 1 .  vitamin E 400 UNIT capsule, Take by mouth., Disp: , Rfl:  .  mupirocin ointment (BACTROBAN) 2 %, Apply 1 application topically 2 (two) times daily., Disp: 22 g, Rfl: 0  IMPRESSION:    ICD-10-CM   1. Palpitations  R00.2 EKG 12-Lead  2. Paroxysmal SVT (supraventricular tachycardia) (HCC)  I47.1   3. NSVT (nonsustained ventricular tachycardia) (HCC)  I47.2   4. Type 2 diabetes mellitus without complication, without long-term current use of insulin (HCC)  E11.9   5. Former smoker  Z87.891   59. Benign hypertension  I10   7. Mixed hyperlipidemia  E78.2   8. Hx of breast cancer  Z85.3   9. Class 3 severe obesity due to excess calories with serious comorbidity and body mass index (BMI) of 40.0 to 44.9 in adult [facility])  E66.01    Z68.41      RECOMMENDATIONS: Bo Rogue is a 54 y.o. female whose past medical history and cardiac risk factors include: History of breast cancer status post chemotherapy/radiation/surgery, history of CVA per MRI findings noted below, non-insulin-dependent diabetes mellitus type 2, mixed hyperlipidemia, hypertension, postmenopausal female, obesity.  Palpitations:  Significantly  improved after the initiation/up titration of pharmacological therapy.  Continue to monitor.  Nonsustained ventricular tachycardia: Asymptomatic.  Continue beta-blocker therapy.  He also has a firm "question  Ischemic evaluation performed and noted above for further reference.  LVEF is preserved, no evidence of NSVT on repeat monitor.    EKG today reviewed and personally interpreted, findings noted above.  Continue medical management  Paroxysmal supraventricular tachycardia: Continue beta-blocker therapy.  See above  Non-insulin-dependent diabetes mellitus type 2: Currently managed per primary team.  Hyperlipidemia, mixed:  Most recent lipid profile reviewed and discussed with the patient.  Currently managed by primary team.  Benign essential hypertension: . Office blood pressure is not at goal.  . Patient states that she takes lisinopril 80 mg p.o. daily around noon.  She has not taken her antihypertensive medications today which may be contributing to her elevated blood pressure reading. . Given the fact that she is a  diabetic I would recommend a systolic blood pressures around 130 mmHg. Marland Kitchen Patient states that she has an upcoming appointment with her PCP in approximately 2 weeks.  I have asked her to keep a log of her blood pressures and to show it to her PCP to see if further medication titration is warranted. . Reemphasized the importance of a low-salt diet. . If for any reason she is unable to reach out to her PCP she is more than welcome to call the office back for further direction.  History of CVA:  Patient does not recall any prior history of having a stroke.  This was discovered during the work-up of her lightheaded and dizziness on MRI.  Patient denies any prior history of paroxysmal atrial fibrillation.  No atrial fibrillation detected during the monitoring period.  Continue to modify risk factors for secondary prevention.  Orders Placed This Encounter    Procedures  . EKG 12-Lead   --Continue cardiac medications as reconciled in final medication list. --Return in about 1 year (around 08/03/2021) for Hx of NSVT, PSVT, palpitations. . Or sooner if needed. --Continue follow-up with your primary care physician regarding the management of your other chronic comorbid conditions.  Patient's questions and concerns were addressed to her satisfaction. She voices understanding of the instructions provided during this encounter.   This note was created using a voice recognition software as a result there may be grammatical errors inadvertently enclosed that do not reflect the nature of this encounter. Every attempt is made to correct such errors.  Rex Kras, [place], [facility]  Pager: [phone] Office: [phone]

## 2020-08-10 DIAGNOSIS — E538 Deficiency of other specified B group vitamins: Secondary | ICD-10-CM | POA: Diagnosis not present

## 2020-08-10 DIAGNOSIS — E782 Mixed hyperlipidemia: Secondary | ICD-10-CM | POA: Diagnosis not present

## 2020-08-10 DIAGNOSIS — E1129 Type 2 diabetes mellitus with other diabetic kidney complication: Secondary | ICD-10-CM | POA: Diagnosis not present

## 2020-08-10 DIAGNOSIS — E1165 Type 2 diabetes mellitus with hyperglycemia: Secondary | ICD-10-CM | POA: Diagnosis not present

## 2020-08-10 DIAGNOSIS — I10 Essential (primary) hypertension: Secondary | ICD-10-CM | POA: Diagnosis not present

## 2020-08-17 DIAGNOSIS — I10 Essential (primary) hypertension: Secondary | ICD-10-CM | POA: Diagnosis not present

## 2020-08-17 DIAGNOSIS — E782 Mixed hyperlipidemia: Secondary | ICD-10-CM | POA: Diagnosis not present

## 2020-08-17 DIAGNOSIS — E1129 Type 2 diabetes mellitus with other diabetic kidney complication: Secondary | ICD-10-CM | POA: Diagnosis not present

## 2020-08-17 DIAGNOSIS — E538 Deficiency of other specified B group vitamins: Secondary | ICD-10-CM | POA: Diagnosis not present

## 2020-10-08 DIAGNOSIS — H04123 Dry eye syndrome of bilateral lacrimal glands: Secondary | ICD-10-CM | POA: Diagnosis not present

## 2020-10-08 DIAGNOSIS — E119 Type 2 diabetes mellitus without complications: Secondary | ICD-10-CM | POA: Diagnosis not present

## 2020-10-08 DIAGNOSIS — H35363 Drusen (degenerative) of macula, bilateral: Secondary | ICD-10-CM | POA: Diagnosis not present

## 2020-10-08 DIAGNOSIS — H2513 Age-related nuclear cataract, bilateral: Secondary | ICD-10-CM | POA: Diagnosis not present

## 2020-10-21 DIAGNOSIS — Z853 Personal history of malignant neoplasm of breast: Secondary | ICD-10-CM | POA: Diagnosis not present

## 2020-10-21 DIAGNOSIS — Z9012 Acquired absence of left breast and nipple: Secondary | ICD-10-CM | POA: Diagnosis not present

## 2020-10-21 DIAGNOSIS — Z08 Encounter for follow-up examination after completed treatment for malignant neoplasm: Secondary | ICD-10-CM | POA: Diagnosis not present

## 2020-10-21 DIAGNOSIS — R922 Inconclusive mammogram: Secondary | ICD-10-CM | POA: Diagnosis not present

## 2020-10-27 DIAGNOSIS — I10 Essential (primary) hypertension: Secondary | ICD-10-CM | POA: Diagnosis not present

## 2020-10-27 DIAGNOSIS — E538 Deficiency of other specified B group vitamins: Secondary | ICD-10-CM | POA: Diagnosis not present

## 2020-10-27 DIAGNOSIS — E1129 Type 2 diabetes mellitus with other diabetic kidney complication: Secondary | ICD-10-CM | POA: Diagnosis not present

## 2020-10-27 DIAGNOSIS — E782 Mixed hyperlipidemia: Secondary | ICD-10-CM | POA: Diagnosis not present

## 2020-11-03 DIAGNOSIS — E1129 Type 2 diabetes mellitus with other diabetic kidney complication: Secondary | ICD-10-CM | POA: Diagnosis not present

## 2020-11-03 DIAGNOSIS — I10 Essential (primary) hypertension: Secondary | ICD-10-CM | POA: Diagnosis not present

## 2020-11-03 DIAGNOSIS — E782 Mixed hyperlipidemia: Secondary | ICD-10-CM | POA: Diagnosis not present

## 2020-11-09 ENCOUNTER — Other Ambulatory Visit: Payer: Self-pay | Admitting: Cardiology

## 2020-11-09 DIAGNOSIS — R002 Palpitations: Secondary | ICD-10-CM

## 2020-11-09 DIAGNOSIS — I4729 Other ventricular tachycardia: Secondary | ICD-10-CM

## 2020-11-09 DIAGNOSIS — I472 Ventricular tachycardia: Secondary | ICD-10-CM

## 2020-11-09 DIAGNOSIS — I471 Supraventricular tachycardia: Secondary | ICD-10-CM

## 2020-12-02 DIAGNOSIS — Z79890 Hormone replacement therapy (postmenopausal): Secondary | ICD-10-CM | POA: Diagnosis not present

## 2020-12-02 DIAGNOSIS — E039 Hypothyroidism, unspecified: Secondary | ICD-10-CM | POA: Diagnosis not present

## 2020-12-02 DIAGNOSIS — Z9889 Other specified postprocedural states: Secondary | ICD-10-CM | POA: Diagnosis not present

## 2020-12-02 DIAGNOSIS — Z9221 Personal history of antineoplastic chemotherapy: Secondary | ICD-10-CM | POA: Diagnosis not present

## 2020-12-02 DIAGNOSIS — Z6841 Body Mass Index (BMI) 40.0 and over, adult: Secondary | ICD-10-CM | POA: Diagnosis not present

## 2020-12-02 DIAGNOSIS — Z170 Estrogen receptor positive status [ER+]: Secondary | ICD-10-CM | POA: Diagnosis not present

## 2020-12-02 DIAGNOSIS — D649 Anemia, unspecified: Secondary | ICD-10-CM | POA: Diagnosis not present

## 2020-12-02 DIAGNOSIS — Z923 Personal history of irradiation: Secondary | ICD-10-CM | POA: Diagnosis not present

## 2020-12-02 DIAGNOSIS — D72810 Lymphocytopenia: Secondary | ICD-10-CM | POA: Diagnosis not present

## 2020-12-02 DIAGNOSIS — I10 Essential (primary) hypertension: Secondary | ICD-10-CM | POA: Diagnosis not present

## 2020-12-02 DIAGNOSIS — C50412 Malignant neoplasm of upper-outer quadrant of left female breast: Secondary | ICD-10-CM | POA: Diagnosis not present

## 2020-12-02 DIAGNOSIS — Z1379 Encounter for other screening for genetic and chromosomal anomalies: Secondary | ICD-10-CM | POA: Diagnosis not present

## 2021-02-16 DIAGNOSIS — I10 Essential (primary) hypertension: Secondary | ICD-10-CM | POA: Diagnosis not present

## 2021-02-16 DIAGNOSIS — E782 Mixed hyperlipidemia: Secondary | ICD-10-CM | POA: Diagnosis not present

## 2021-02-16 DIAGNOSIS — E063 Autoimmune thyroiditis: Secondary | ICD-10-CM | POA: Diagnosis not present

## 2021-02-16 DIAGNOSIS — E1165 Type 2 diabetes mellitus with hyperglycemia: Secondary | ICD-10-CM | POA: Diagnosis not present

## 2021-04-22 ENCOUNTER — Ambulatory Visit: Payer: Federal, State, Local not specified - PPO | Admitting: Family Medicine

## 2021-04-30 ENCOUNTER — Other Ambulatory Visit: Payer: Self-pay | Admitting: Cardiology

## 2021-04-30 DIAGNOSIS — R002 Palpitations: Secondary | ICD-10-CM

## 2021-04-30 DIAGNOSIS — I471 Supraventricular tachycardia: Secondary | ICD-10-CM

## 2021-04-30 DIAGNOSIS — I4729 Other ventricular tachycardia: Secondary | ICD-10-CM

## 2021-05-31 DIAGNOSIS — I10 Essential (primary) hypertension: Secondary | ICD-10-CM | POA: Diagnosis not present

## 2021-05-31 DIAGNOSIS — Z0000 Encounter for general adult medical examination without abnormal findings: Secondary | ICD-10-CM | POA: Diagnosis not present

## 2021-05-31 DIAGNOSIS — E782 Mixed hyperlipidemia: Secondary | ICD-10-CM | POA: Diagnosis not present

## 2021-05-31 DIAGNOSIS — E1165 Type 2 diabetes mellitus with hyperglycemia: Secondary | ICD-10-CM | POA: Diagnosis not present

## 2021-06-29 DIAGNOSIS — I10 Essential (primary) hypertension: Secondary | ICD-10-CM | POA: Diagnosis not present

## 2021-06-29 DIAGNOSIS — Z170 Estrogen receptor positive status [ER+]: Secondary | ICD-10-CM | POA: Diagnosis not present

## 2021-06-29 DIAGNOSIS — C50412 Malignant neoplasm of upper-outer quadrant of left female breast: Secondary | ICD-10-CM | POA: Diagnosis not present

## 2021-06-29 DIAGNOSIS — E118 Type 2 diabetes mellitus with unspecified complications: Secondary | ICD-10-CM | POA: Diagnosis not present

## 2021-06-29 DIAGNOSIS — I471 Supraventricular tachycardia: Secondary | ICD-10-CM | POA: Diagnosis not present

## 2021-06-29 DIAGNOSIS — Z6841 Body Mass Index (BMI) 40.0 and over, adult: Secondary | ICD-10-CM | POA: Diagnosis not present

## 2021-06-29 DIAGNOSIS — D72810 Lymphocytopenia: Secondary | ICD-10-CM | POA: Diagnosis not present

## 2021-06-29 DIAGNOSIS — Z1231 Encounter for screening mammogram for malignant neoplasm of breast: Secondary | ICD-10-CM | POA: Diagnosis not present

## 2021-06-29 DIAGNOSIS — Z1379 Encounter for other screening for genetic and chromosomal anomalies: Secondary | ICD-10-CM | POA: Diagnosis not present

## 2021-06-29 DIAGNOSIS — R252 Cramp and spasm: Secondary | ICD-10-CM | POA: Diagnosis not present

## 2021-07-16 DIAGNOSIS — E118 Type 2 diabetes mellitus with unspecified complications: Secondary | ICD-10-CM | POA: Diagnosis not present

## 2021-07-16 DIAGNOSIS — Z170 Estrogen receptor positive status [ER+]: Secondary | ICD-10-CM | POA: Diagnosis not present

## 2021-07-16 DIAGNOSIS — C50412 Malignant neoplasm of upper-outer quadrant of left female breast: Secondary | ICD-10-CM | POA: Diagnosis not present

## 2021-07-26 DIAGNOSIS — E1122 Type 2 diabetes mellitus with diabetic chronic kidney disease: Secondary | ICD-10-CM | POA: Diagnosis not present

## 2021-07-26 DIAGNOSIS — E039 Hypothyroidism, unspecified: Secondary | ICD-10-CM | POA: Diagnosis not present

## 2021-07-26 DIAGNOSIS — N1831 Chronic kidney disease, stage 3a: Secondary | ICD-10-CM | POA: Diagnosis not present

## 2021-07-26 DIAGNOSIS — I129 Hypertensive chronic kidney disease with stage 1 through stage 4 chronic kidney disease, or unspecified chronic kidney disease: Secondary | ICD-10-CM | POA: Diagnosis not present

## 2021-07-28 ENCOUNTER — Ambulatory Visit: Payer: Federal, State, Local not specified - PPO | Admitting: Nurse Practitioner

## 2021-08-03 ENCOUNTER — Ambulatory Visit: Payer: Federal, State, Local not specified - PPO | Admitting: Cardiology

## 2021-08-10 ENCOUNTER — Ambulatory Visit: Payer: Federal, State, Local not specified - PPO | Admitting: Cardiology

## 2021-08-20 ENCOUNTER — Ambulatory Visit: Payer: Federal, State, Local not specified - PPO | Admitting: Cardiology

## 2021-09-13 ENCOUNTER — Other Ambulatory Visit: Payer: Self-pay

## 2021-09-13 ENCOUNTER — Encounter: Payer: Self-pay | Admitting: Cardiology

## 2021-09-13 ENCOUNTER — Ambulatory Visit: Payer: Federal, State, Local not specified - PPO | Admitting: Cardiology

## 2021-09-13 VITALS — BP 133/85 | HR 96 | Temp 98.0°F | Resp 16 | Ht 65.0 in | Wt 238.0 lb

## 2021-09-13 DIAGNOSIS — E119 Type 2 diabetes mellitus without complications: Secondary | ICD-10-CM

## 2021-09-13 DIAGNOSIS — I10 Essential (primary) hypertension: Secondary | ICD-10-CM

## 2021-09-13 DIAGNOSIS — I471 Supraventricular tachycardia: Secondary | ICD-10-CM | POA: Diagnosis not present

## 2021-09-13 DIAGNOSIS — Z8673 Personal history of transient ischemic attack (TIA), and cerebral infarction without residual deficits: Secondary | ICD-10-CM

## 2021-09-13 DIAGNOSIS — Z853 Personal history of malignant neoplasm of breast: Secondary | ICD-10-CM

## 2021-09-13 DIAGNOSIS — R002 Palpitations: Secondary | ICD-10-CM

## 2021-09-13 DIAGNOSIS — I4729 Other ventricular tachycardia: Secondary | ICD-10-CM | POA: Diagnosis not present

## 2021-09-13 DIAGNOSIS — E782 Mixed hyperlipidemia: Secondary | ICD-10-CM | POA: Diagnosis not present

## 2021-09-13 DIAGNOSIS — Z87891 Personal history of nicotine dependence: Secondary | ICD-10-CM

## 2021-09-13 NOTE — Progress Notes (Signed)
Brenda Johnson Date of Birth: [birth year] MRN: [id number] Primary Care Provider:Ramachandran, Mauro Kaufmann, MD  Date: 09/13/21 Last Office Visit: 08/03/2020.  Chief Complaint  Patient presents with   Palpitations   Follow-up    HPI  Brenda Johnson is a 56 y.o. female who presents to the office with a chief complaint of " 1 year follow-up palpitation and history of NSVT." Patient's past medical history and cardiac risk factors include: History of breast cancer status post chemotherapy/radiation/surgery, history of CVA per MRI findings noted below, non-insulin-dependent diabetes mellitus type 2, mixed hyperlipidemia, hypertension, postmenopausal female, obesity.    Referred to the practice back in March 2021 for evaluation of ventricular tachycardia.  She had a monitor at outside facility which noted NSVT which prompted cardiovascular evaluation.  Patient underwent an ischemic evaluation results noted below for further reference and was started on Toprol-XL.  The medications were further titrated up until symptom management.  She now presents for 1 year follow-up.  From a cardiovascular standpoint she is doing well.  No hospitalizations or urgent care visits for cardiovascular symptoms.  Very rare and infrequent episodes of palpitations on current medical regimen.  Patient plans to be transitioned from lisinopril 40 mg p.o. twice daily to olmesartan 40 mg p.o. daily as directed by her nephrologist.  I have no reservation with regards to this transition.  ALLERGIES: Allergies  Allergen Reactions   Aleve [Naproxen Sodium] Anaphylaxis   Magnesium Sulfate Rash   MEDICATION LIST PRIOR TO VISIT: Current Outpatient Medications on File Prior to Visit  Medication Sig Dispense Refill   aspirin EC 81 MG tablet Take by mouth.     atorvastatin (LIPITOR) 10 MG tablet Take 10 mg by mouth daily.     cholecalciferol (VITAMIN D) 25 MCG (1000 UT) tablet Take by mouth.     famotidine (PEPCID) 20 MG tablet Take by  mouth.     FARXIGA 10 MG TABS tablet Take 10 mg by mouth daily.     letrozole (FEMARA) 2.5 MG tablet      levothyroxine (SYNTHROID) 75 MCG tablet Take 75 mcg by mouth daily before breakfast.     lisinopril (PRINIVIL,ZESTRIL) 40 MG tablet Take 40 mg by mouth 2 (two) times daily.      magnesium oxide (MAG-OX) 400 MG tablet Take 1 tablet by mouth daily.     metFORMIN (GLUCOPHAGE-XR) 500 MG 24 hr tablet Take 1,000 mg by mouth 2 (two) times daily.     metoprolol succinate (TOPROL-XL) 50 MG 24 hr tablet TAKE 1 TAB EVERY MORNING *HOLD IF SYSTOLIC BP (TOP #) IS LESS THAN 100 OR PULSE LESS THAN 60 90 tablet 1   vitamin E 400 UNIT capsule Take by mouth.     No current facility-administered medications on file prior to visit.    PAST MEDICAL HISTORY: Past Medical History:  Diagnosis Date   Breast cancer ([place]) 2019   Cancer ([place]) 10/2017   BREAST -CHEMO AND RADIATION   CVA (cerebral vascular accident) ([place])    MRI of the head with contrast 09/13/2018 Per records noted small subacute cortical infarction, located in the left posterior frontal lobe.   Diabetes ([place])    Diabetes mellitus without complication ([place])    High cholesterol    Hypertension    Shingles 12/2019    PAST SURGICAL HISTORY: Past Surgical History:  Procedure Laterality Date   BREAST SURGERY Left 2019   LUMPECTOMY    TUBAL LIGATION      FAMILY HISTORY: The patient family history includes  Asthma in her brother; Cancer in her father and mother; Hypertension in her brother, father, and sister; Seizures in her brother; Thyroid disease in her sister.   SOCIAL HISTORY:  The patient  reports that she quit smoking about 24 years ago. Her smoking use included cigarettes. She has never used smokeless tobacco. She reports current alcohol use. She reports that she does not use drugs.  Review of Systems  Constitutional: Negative for chills and fever.  HENT:  Negative for hoarse voice and nosebleeds.   Eyes:  Negative for discharge,  double vision and pain.  Cardiovascular:  Negative for chest pain, claudication, dyspnea on exertion, leg swelling, near-syncope, orthopnea, palpitations, paroxysmal nocturnal dyspnea and syncope.  Respiratory:  Negative for hemoptysis and shortness of breath.   Musculoskeletal:  Negative for muscle cramps and myalgias.  Gastrointestinal:  Negative for abdominal pain, constipation, diarrhea, hematemesis, hematochezia, melena, nausea and vomiting.  Neurological:  Negative for dizziness and light-headedness.   PHYSICAL EXAM: Vitals with BMI 09/13/2021 08/03/2020 07/27/2020  Height $Remov'5\' 5"'RGKITq$  $Remove'5\' 5"'yjvzUcT$  $RemoveB'5\' 5"'iKZydMJm$   Weight 238 lbs 246 lbs 247 lbs  BMI 39.61 30.07 62.2  Systolic 633 354 562  Diastolic 85 84 84  Pulse 96 83 -    CONSTITUTIONAL: Well-developed and well-nourished. No acute distress.  SKIN: Skin is warm and dry. No rash noted. No cyanosis. No pallor. No jaundice HEAD: Normocephalic and atraumatic.  EYES: No scleral icterus MOUTH/THROAT: Moist oral membranes.  NECK: No JVD present. No thyromegaly noted. No carotid bruits  LYMPHATIC: No visible cervical adenopathy.  CHEST Normal respiratory effort. No intercostal retractions  LUNGS: Clear to auscultation bilaterally.  No stridor. No wheezes. No rales.  CARDIOVASCULAR: Regular rate and rhythm, positive S1-S2, no murmurs rubs or gallops appreciated ABDOMINAL: Obese, soft, nontender, nondistended, positive bowel sounds in all 4 quadrants.  No apparent ascites.  EXTREMITIES: No peripheral edema, 2+ dorsalis pedis pulses bilaterally, warm to touch. HEMATOLOGIC: No significant bruising NEUROLOGIC: Oriented to person, place, and time. Nonfocal. Normal muscle tone.  PSYCHIATRIC: Normal mood and affect. Normal behavior. Cooperative  CARDIAC DATABASE: EKG: 09/13/2021, NSR, 91 bpm, poor R wave progression, nonspecific T wave abnormality.   Echocardiogram: 10/17/2019 at [facility]: LVEF 65 to 70%, aortic valve sclerosis without  stenosis normal diastolic function, trace MR, mild TR, RVSP 20-30 mmHg.  Stress Testing:  Lexiscan (Walking with mod Bruce) Sestamibi Stress Test 12/11/2019: Nondiagnostic ECG stress. Myocardial perfusion is normal with mild breast tissue attenuation in the inferior wall.  Stress LV EF: 68% without wall motion abnormality.  No previous exam available for comparison. Low risk study.   Heart Catheterization: None  Carotid duplex: 10/04/2019 at [facility]: No hemodynamics significant stenosis on either sides.  Both vertebral arteries are patent with antegrade flow.  18 - day Mobile Cardiac Ambulatory Telemetry: Enrollment Period: 12/25/2019 - 01/11/2020 Predominant rhythm normal sinus rhythm with ventricular rate ranging from 49 - 156 bpm, average heart rate 71 bpm. No pauses greater than or equal to 2.5 seconds. No atrial fibrillation detected during the monitoring period.   Total supraventricular ectopic burden 0.01% (81 supraventricular ectopy, 3 supraventricular runs). Longest run was 6 beats in duration at 100 bpm and fastest run was 4 beats in duration at rate of 156 bpm.  Total ventricular ectopic burden 0.02% (193 ventricular ectopy, 1 ventricular pairs and 0 ventricular runs). Heart rate < 60 bpm for 10.8% of the recording. Heart rate > 100 bpm for 3.8% of the recording. 35 Patient triggered events reviewed, not  associated with any significant arrhythmia and underlying rhythm was normal sinus.  LABORATORY DATA: External Labs: Collected: January 2021 Creatinine 0.8 mg/dL. eGFR: 79 mL/min per 1.73 m Lipid profile: Total cholesterol 155, triglycerides 125, HDL 41, LDL 89, non-HDL 114 Hemoglobin A1c: 7.9  FINAL MEDICATION LIST END OF ENCOUNTER: No orders of the defined types were placed in this encounter.   There are no discontinued medications.    Current Outpatient Medications:    aspirin EC 81 MG tablet, Take by mouth., Disp: , Rfl:    atorvastatin (LIPITOR) 10 MG  tablet, Take 10 mg by mouth daily., Disp: , Rfl:    cholecalciferol (VITAMIN D) 25 MCG (1000 UT) tablet, Take by mouth., Disp: , Rfl:    famotidine (PEPCID) 20 MG tablet, Take by mouth., Disp: , Rfl:    FARXIGA 10 MG TABS tablet, Take 10 mg by mouth daily., Disp: , Rfl:    letrozole (FEMARA) 2.5 MG tablet, , Disp: , Rfl:    levothyroxine (SYNTHROID) 75 MCG tablet, Take 75 mcg by mouth daily before breakfast., Disp: , Rfl:    lisinopril (PRINIVIL,ZESTRIL) 40 MG tablet, Take 40 mg by mouth 2 (two) times daily. , Disp: , Rfl:    magnesium oxide (MAG-OX) 400 MG tablet, Take 1 tablet by mouth daily., Disp: , Rfl:    metFORMIN (GLUCOPHAGE-XR) 500 MG 24 hr tablet, Take 1,000 mg by mouth 2 (two) times daily., Disp: , Rfl:    metoprolol succinate (TOPROL-XL) 50 MG 24 hr tablet, TAKE 1 TAB EVERY MORNING *HOLD IF SYSTOLIC BP (TOP #) IS LESS THAN 100 OR PULSE LESS THAN 60, Disp: 90 tablet, Rfl: 1   vitamin E 400 UNIT capsule, Take by mouth., Disp: , Rfl:   IMPRESSION:    ICD-10-CM   1. Palpitations  R00.2     2. Paroxysmal SVT (supraventricular tachycardia) (HCC)  I47.1 EKG 12-Lead    3. NSVT (nonsustained ventricular tachycardia)  I47.29     4. Type 2 diabetes mellitus without complication, without long-term current use of insulin (HCC)  E11.9     5. Benign hypertension  I10     6. Mixed hyperlipidemia  E78.2     7. Former smoker  Z87.891     2. Hx of breast cancer  Z85.3     9. History of stroke  Z86.73        RECOMMENDATIONS: Brenda Johnson is a 56 y.o. female whose past medical history and cardiac risk factors include: History of breast cancer status post chemotherapy/radiation/surgery, history of CVA per MRI findings noted below, non-insulin-dependent diabetes mellitus type 2, mixed hyperlipidemia, hypertension, postmenopausal female, obesity.  Palpitations/paroxysmal supraventricular tachycardia/NSVT: Presents today for 1 year follow-up visit. Doing well symptomatically. Currently on  Toprol-XL. Has undergone recent ischemic evaluation as noted above. No additional cardiovascular testing warranted at this time as she is asymptomatic.  Type 2 diabetes mellitus without complication, without long-term current use of insulin (HCC) Currently on lisinopril, Farxiga Educated importance of glycemic control. Currently managed by primary care provider. Recently had labs today for her upcoming annual follow-up visit with PCP.  Benign hypertension Office blood pressures are well controlled.  Medications reconciled. She is currently on lisinopril 40 mg p.o. twice daily.  Patient asked if it would be okay from a cardiovascular standpoint to transition to olmesartan.  I reassured her that olmesartan was a good medication and I have no reservation. Currently managed by primary care provider.  Mixed hyperlipidemia Currently on atorvastatin.   She denies myalgia  or other side effects. Most recent lipids were checked earlier today according to the patient, results pending Currently managed by primary care provider.  History of CVA: Patient does not recall any prior history of having a stroke.  This was discovered during the work-up of her lightheaded and dizziness on MRI. No prior history of paroxysmal atrial fibrillation none detected during the last monitor. Continue to modify risk factors for secondary prevention.  Orders Placed This Encounter  Procedures   EKG 12-Lead   --Continue cardiac medications as reconciled in final medication list. --Return in about 1 year (around 09/13/2022) for Annual follow up (Hx of NVST, palpitations). . Or sooner if needed. --Continue follow-up with your primary care physician regarding the management of your other chronic comorbid conditions.  Patient's questions and concerns were addressed to her satisfaction. She voices understanding of the instructions provided during this encounter.   This note was created using a voice recognition software  as a result there may be grammatical errors inadvertently enclosed that do not reflect the nature of this encounter. Every attempt is made to correct such errors.  Total time spent: 30 minutes.  Rex Kras, [place], [facility]  Pager: [phone] Office: [phone]

## 2021-09-20 DIAGNOSIS — I10 Essential (primary) hypertension: Secondary | ICD-10-CM | POA: Diagnosis not present

## 2021-09-20 DIAGNOSIS — E782 Mixed hyperlipidemia: Secondary | ICD-10-CM | POA: Diagnosis not present

## 2021-09-20 DIAGNOSIS — E1165 Type 2 diabetes mellitus with hyperglycemia: Secondary | ICD-10-CM | POA: Diagnosis not present

## 2021-10-01 DIAGNOSIS — D2362 Other benign neoplasm of skin of left upper limb, including shoulder: Secondary | ICD-10-CM | POA: Diagnosis not present

## 2021-10-01 DIAGNOSIS — L720 Epidermal cyst: Secondary | ICD-10-CM | POA: Diagnosis not present

## 2021-10-01 DIAGNOSIS — L638 Other alopecia areata: Secondary | ICD-10-CM | POA: Diagnosis not present

## 2021-10-01 DIAGNOSIS — L918 Other hypertrophic disorders of the skin: Secondary | ICD-10-CM | POA: Diagnosis not present

## 2021-10-01 DIAGNOSIS — D1801 Hemangioma of skin and subcutaneous tissue: Secondary | ICD-10-CM | POA: Diagnosis not present

## 2021-10-22 DIAGNOSIS — Z1231 Encounter for screening mammogram for malignant neoplasm of breast: Secondary | ICD-10-CM | POA: Diagnosis not present

## 2021-11-11 DIAGNOSIS — L918 Other hypertrophic disorders of the skin: Secondary | ICD-10-CM | POA: Diagnosis not present

## 2021-12-23 DIAGNOSIS — L649 Androgenic alopecia, unspecified: Secondary | ICD-10-CM | POA: Diagnosis not present

## 2021-12-29 DIAGNOSIS — C50412 Malignant neoplasm of upper-outer quadrant of left female breast: Secondary | ICD-10-CM | POA: Diagnosis not present

## 2021-12-29 DIAGNOSIS — Z79811 Long term (current) use of aromatase inhibitors: Secondary | ICD-10-CM | POA: Diagnosis not present

## 2021-12-29 DIAGNOSIS — E039 Hypothyroidism, unspecified: Secondary | ICD-10-CM | POA: Diagnosis not present

## 2021-12-29 DIAGNOSIS — Z1379 Encounter for other screening for genetic and chromosomal anomalies: Secondary | ICD-10-CM | POA: Diagnosis not present

## 2021-12-29 DIAGNOSIS — I10 Essential (primary) hypertension: Secondary | ICD-10-CM | POA: Diagnosis not present

## 2021-12-29 DIAGNOSIS — D72810 Lymphocytopenia: Secondary | ICD-10-CM | POA: Diagnosis not present

## 2021-12-29 DIAGNOSIS — Z13820 Encounter for screening for osteoporosis: Secondary | ICD-10-CM | POA: Diagnosis not present

## 2021-12-29 DIAGNOSIS — Z170 Estrogen receptor positive status [ER+]: Secondary | ICD-10-CM | POA: Diagnosis not present

## 2021-12-29 DIAGNOSIS — R7989 Other specified abnormal findings of blood chemistry: Secondary | ICD-10-CM | POA: Diagnosis not present

## 2021-12-29 DIAGNOSIS — Z5181 Encounter for therapeutic drug level monitoring: Secondary | ICD-10-CM | POA: Diagnosis not present

## 2021-12-29 DIAGNOSIS — Z6841 Body Mass Index (BMI) 40.0 and over, adult: Secondary | ICD-10-CM | POA: Diagnosis not present

## 2021-12-31 DIAGNOSIS — Z170 Estrogen receptor positive status [ER+]: Secondary | ICD-10-CM | POA: Diagnosis not present

## 2021-12-31 DIAGNOSIS — C50412 Malignant neoplasm of upper-outer quadrant of left female breast: Secondary | ICD-10-CM | POA: Diagnosis not present

## 2021-12-31 DIAGNOSIS — K760 Fatty (change of) liver, not elsewhere classified: Secondary | ICD-10-CM | POA: Diagnosis not present

## 2022-01-05 DIAGNOSIS — Z170 Estrogen receptor positive status [ER+]: Secondary | ICD-10-CM | POA: Diagnosis not present

## 2022-01-05 DIAGNOSIS — C50412 Malignant neoplasm of upper-outer quadrant of left female breast: Secondary | ICD-10-CM | POA: Diagnosis not present

## 2022-01-26 DIAGNOSIS — H04123 Dry eye syndrome of bilateral lacrimal glands: Secondary | ICD-10-CM | POA: Diagnosis not present

## 2022-01-26 DIAGNOSIS — H524 Presbyopia: Secondary | ICD-10-CM | POA: Diagnosis not present

## 2022-01-26 DIAGNOSIS — N1831 Chronic kidney disease, stage 3a: Secondary | ICD-10-CM | POA: Diagnosis not present

## 2022-01-26 DIAGNOSIS — H25813 Combined forms of age-related cataract, bilateral: Secondary | ICD-10-CM | POA: Diagnosis not present

## 2022-01-26 DIAGNOSIS — E119 Type 2 diabetes mellitus without complications: Secondary | ICD-10-CM | POA: Diagnosis not present

## 2022-02-01 DIAGNOSIS — E782 Mixed hyperlipidemia: Secondary | ICD-10-CM | POA: Diagnosis not present

## 2022-02-01 DIAGNOSIS — N1831 Chronic kidney disease, stage 3a: Secondary | ICD-10-CM | POA: Diagnosis not present

## 2022-02-01 DIAGNOSIS — E1122 Type 2 diabetes mellitus with diabetic chronic kidney disease: Secondary | ICD-10-CM | POA: Diagnosis not present

## 2022-02-01 DIAGNOSIS — I129 Hypertensive chronic kidney disease with stage 1 through stage 4 chronic kidney disease, or unspecified chronic kidney disease: Secondary | ICD-10-CM | POA: Diagnosis not present

## 2022-02-07 DIAGNOSIS — K760 Fatty (change of) liver, not elsewhere classified: Secondary | ICD-10-CM | POA: Diagnosis not present

## 2022-02-07 DIAGNOSIS — E782 Mixed hyperlipidemia: Secondary | ICD-10-CM | POA: Diagnosis not present

## 2022-02-07 DIAGNOSIS — N1831 Chronic kidney disease, stage 3a: Secondary | ICD-10-CM | POA: Diagnosis not present

## 2022-02-07 DIAGNOSIS — E1165 Type 2 diabetes mellitus with hyperglycemia: Secondary | ICD-10-CM | POA: Diagnosis not present

## 2022-05-03 DIAGNOSIS — Z13820 Encounter for screening for osteoporosis: Secondary | ICD-10-CM | POA: Diagnosis not present

## 2022-05-03 DIAGNOSIS — Z780 Asymptomatic menopausal state: Secondary | ICD-10-CM | POA: Diagnosis not present

## 2022-06-14 DIAGNOSIS — K760 Fatty (change of) liver, not elsewhere classified: Secondary | ICD-10-CM | POA: Diagnosis not present

## 2022-06-14 DIAGNOSIS — E1165 Type 2 diabetes mellitus with hyperglycemia: Secondary | ICD-10-CM | POA: Diagnosis not present

## 2022-06-14 DIAGNOSIS — N1831 Chronic kidney disease, stage 3a: Secondary | ICD-10-CM | POA: Diagnosis not present

## 2022-06-14 DIAGNOSIS — E782 Mixed hyperlipidemia: Secondary | ICD-10-CM | POA: Diagnosis not present

## 2022-06-20 DIAGNOSIS — E1165 Type 2 diabetes mellitus with hyperglycemia: Secondary | ICD-10-CM | POA: Diagnosis not present

## 2022-06-20 DIAGNOSIS — E782 Mixed hyperlipidemia: Secondary | ICD-10-CM | POA: Diagnosis not present

## 2022-06-20 DIAGNOSIS — I10 Essential (primary) hypertension: Secondary | ICD-10-CM | POA: Diagnosis not present

## 2022-06-20 DIAGNOSIS — Z0000 Encounter for general adult medical examination without abnormal findings: Secondary | ICD-10-CM | POA: Diagnosis not present

## 2022-06-20 DIAGNOSIS — E11649 Type 2 diabetes mellitus with hypoglycemia without coma: Secondary | ICD-10-CM | POA: Diagnosis not present

## 2022-06-30 DIAGNOSIS — K760 Fatty (change of) liver, not elsewhere classified: Secondary | ICD-10-CM | POA: Diagnosis not present

## 2022-06-30 DIAGNOSIS — I10 Essential (primary) hypertension: Secondary | ICD-10-CM | POA: Diagnosis not present

## 2022-06-30 DIAGNOSIS — E118 Type 2 diabetes mellitus with unspecified complications: Secondary | ICD-10-CM | POA: Diagnosis not present

## 2022-06-30 DIAGNOSIS — Z170 Estrogen receptor positive status [ER+]: Secondary | ICD-10-CM | POA: Diagnosis not present

## 2022-06-30 DIAGNOSIS — Z1379 Encounter for other screening for genetic and chromosomal anomalies: Secondary | ICD-10-CM | POA: Diagnosis not present

## 2022-06-30 DIAGNOSIS — Z1231 Encounter for screening mammogram for malignant neoplasm of breast: Secondary | ICD-10-CM | POA: Diagnosis not present

## 2022-06-30 DIAGNOSIS — C50412 Malignant neoplasm of upper-outer quadrant of left female breast: Secondary | ICD-10-CM | POA: Diagnosis not present

## 2022-09-08 DIAGNOSIS — R7989 Other specified abnormal findings of blood chemistry: Secondary | ICD-10-CM | POA: Diagnosis not present

## 2022-09-08 DIAGNOSIS — Z170 Estrogen receptor positive status [ER+]: Secondary | ICD-10-CM | POA: Diagnosis not present

## 2022-09-08 DIAGNOSIS — Z5181 Encounter for therapeutic drug level monitoring: Secondary | ICD-10-CM | POA: Diagnosis not present

## 2022-09-08 DIAGNOSIS — Z6841 Body Mass Index (BMI) 40.0 and over, adult: Secondary | ICD-10-CM | POA: Diagnosis not present

## 2022-09-08 DIAGNOSIS — I10 Essential (primary) hypertension: Secondary | ICD-10-CM | POA: Diagnosis not present

## 2022-09-08 DIAGNOSIS — Z79811 Long term (current) use of aromatase inhibitors: Secondary | ICD-10-CM | POA: Diagnosis not present

## 2022-09-08 DIAGNOSIS — E039 Hypothyroidism, unspecified: Secondary | ICD-10-CM | POA: Diagnosis not present

## 2022-09-08 DIAGNOSIS — K760 Fatty (change of) liver, not elsewhere classified: Secondary | ICD-10-CM | POA: Diagnosis not present

## 2022-09-08 DIAGNOSIS — Z1379 Encounter for other screening for genetic and chromosomal anomalies: Secondary | ICD-10-CM | POA: Diagnosis not present

## 2022-09-08 DIAGNOSIS — C50412 Malignant neoplasm of upper-outer quadrant of left female breast: Secondary | ICD-10-CM | POA: Diagnosis not present

## 2022-09-12 DIAGNOSIS — R92323 Mammographic fibroglandular density, bilateral breasts: Secondary | ICD-10-CM | POA: Diagnosis not present

## 2022-09-12 DIAGNOSIS — N6321 Unspecified lump in the left breast, upper outer quadrant: Secondary | ICD-10-CM | POA: Diagnosis not present

## 2022-09-12 DIAGNOSIS — Z9012 Acquired absence of left breast and nipple: Secondary | ICD-10-CM | POA: Diagnosis not present

## 2022-09-12 DIAGNOSIS — R92322 Mammographic fibroglandular density, left breast: Secondary | ICD-10-CM | POA: Diagnosis not present

## 2022-09-12 DIAGNOSIS — Z853 Personal history of malignant neoplasm of breast: Secondary | ICD-10-CM | POA: Diagnosis not present

## 2022-09-12 DIAGNOSIS — R921 Mammographic calcification found on diagnostic imaging of breast: Secondary | ICD-10-CM | POA: Diagnosis not present

## 2022-09-12 DIAGNOSIS — N644 Mastodynia: Secondary | ICD-10-CM | POA: Diagnosis not present

## 2022-09-13 ENCOUNTER — Ambulatory Visit: Payer: Federal, State, Local not specified - PPO | Admitting: Cardiology

## 2022-09-22 ENCOUNTER — Ambulatory Visit: Payer: Federal, State, Local not specified - PPO | Admitting: Cardiology

## 2022-09-22 ENCOUNTER — Encounter: Payer: Self-pay | Admitting: Cardiology

## 2022-09-22 VITALS — BP 141/82 | HR 66 | Resp 18 | Ht 65.0 in | Wt 234.8 lb

## 2022-09-22 DIAGNOSIS — E782 Mixed hyperlipidemia: Secondary | ICD-10-CM

## 2022-09-22 DIAGNOSIS — I4729 Other ventricular tachycardia: Secondary | ICD-10-CM | POA: Diagnosis not present

## 2022-09-22 DIAGNOSIS — R002 Palpitations: Secondary | ICD-10-CM

## 2022-09-22 DIAGNOSIS — Z8673 Personal history of transient ischemic attack (TIA), and cerebral infarction without residual deficits: Secondary | ICD-10-CM

## 2022-09-22 DIAGNOSIS — I10 Essential (primary) hypertension: Secondary | ICD-10-CM | POA: Diagnosis not present

## 2022-09-22 DIAGNOSIS — Z853 Personal history of malignant neoplasm of breast: Secondary | ICD-10-CM

## 2022-09-22 DIAGNOSIS — I471 Supraventricular tachycardia, unspecified: Secondary | ICD-10-CM | POA: Diagnosis not present

## 2022-09-22 DIAGNOSIS — E119 Type 2 diabetes mellitus without complications: Secondary | ICD-10-CM

## 2022-09-22 DIAGNOSIS — Z87891 Personal history of nicotine dependence: Secondary | ICD-10-CM

## 2022-09-22 NOTE — Progress Notes (Signed)
[name] Date of Birth: March 29, 1966 MRN: [id number] Primary Care Provider:Ramachandran, Mauro Kaufmann, MD  Date: 09/22/22 Last Office Visit: 09/13/2021  Chief Complaint  Patient presents with   Follow-up    Annual follow-up history for NSVT    HPI  Brenda Johnson is a 57 y.o. female whose past medical history and cardiac risk factors include: History of breast cancer status post chemotherapy/radiation/surgery, history of CVA per MRI findings noted below, non-insulin-dependent diabetes mellitus type 2, mixed hyperlipidemia, hypertension, postmenopausal female, obesity.    Patient was referred to the practice for evaluation and management of NSVT which was noted on a cardiac monitor performed for palpitations.  Patient subsequently underwent ischemic workup including an echocardiogram and stress test.  She has done well on pharmacological therapy.  Now presents for 1 year follow-up visit.  Over the last 1 year patient has remained asymptomatic.  Denies any hospitalizations or urgent care visits.  No significant episodes of palpitations.  No near syncope or syncope.  ALLERGIES: Allergies  Allergen Reactions   Aleve [Naproxen Sodium] Anaphylaxis   Magnesium Sulfate Rash   MEDICATION LIST PRIOR TO VISIT: Current Outpatient Medications on File Prior to Visit  Medication Sig Dispense Refill   aspirin EC 81 MG tablet Take by mouth.     atorvastatin (LIPITOR) 10 MG tablet Take 10 mg by mouth daily.     cholecalciferol (VITAMIN D) 25 MCG (1000 UT) tablet Take by mouth.     famotidine (PEPCID) 20 MG tablet Take by mouth.     FARXIGA 10 MG TABS tablet Take 10 mg by mouth daily.     letrozole (FEMARA) 2.5 MG tablet      levothyroxine (SYNTHROID) 75 MCG tablet Take 75 mcg by mouth daily before breakfast.     metFORMIN (GLUCOPHAGE-XR) 500 MG 24 hr tablet Take 1,000 mg by mouth 2 (two) times daily.     metoprolol succinate (TOPROL-XL) 50 MG 24 hr tablet TAKE 1 TAB EVERY MORNING *HOLD IF SYSTOLIC BP  (TOP #) IS LESS THAN 100 OR PULSE LESS THAN 60 90 tablet 1   vitamin E 400 UNIT capsule Take by mouth.     olmesartan (BENICAR) 40 MG tablet Take 40 mg by mouth daily.     No current facility-administered medications on file prior to visit.    PAST MEDICAL HISTORY: Past Medical History:  Diagnosis Date   Breast cancer ([place]) 2019   Cancer ([place]) 10/2017   BREAST -CHEMO AND RADIATION   CVA (cerebral vascular accident) ([place])    MRI of the head with contrast 09/13/2018 Per records noted small subacute cortical infarction, located in the left posterior frontal lobe.   Diabetes ([place])    Diabetes mellitus without complication ([place])    High cholesterol    Hypertension    Shingles 12/2019    PAST SURGICAL HISTORY: Past Surgical History:  Procedure Laterality Date   BREAST SURGERY Left 2019   LUMPECTOMY    TUBAL LIGATION      FAMILY HISTORY: The patient family history includes Asthma in her brother; Cancer in her brother, father, and mother; Hypertension in her brother, father, and sister; Seizures in her brother; Thyroid disease in her sister.   SOCIAL HISTORY:  The patient  reports that she quit smoking about 25 years ago. Her smoking use included cigarettes. She has never used smokeless tobacco. She reports current alcohol use. She reports that she does not use drugs.  Review of Systems  Cardiovascular:  Negative for chest pain, claudication, dyspnea on  exertion, irregular heartbeat, leg swelling, near-syncope, orthopnea, palpitations, paroxysmal nocturnal dyspnea and syncope.  Respiratory:  Negative for shortness of breath.   Hematologic/Lymphatic: Negative for bleeding problem.  Musculoskeletal:  Negative for muscle cramps and myalgias.  Neurological:  Negative for dizziness and light-headedness.    PHYSICAL EXAM:    09/22/2022   10:03 AM 09/13/2021    3:34 PM 08/03/2020   11:49 AM  Vitals with BMI  Height '5\' 5"'$  '5\' 5"'$  '5\' 5"'$   Weight 234 lbs 13 oz 238 lbs 246 lbs  BMI 39.07  60.63 01.60  Systolic 109 323 557  Diastolic 82 85 84  Pulse 66 96 83   Physical Exam  Constitutional: No distress.  Age appropriate, hemodynamically stable.   Neck: No JVD present.  Cardiovascular: Normal rate, regular rhythm, S1 normal, S2 normal, intact distal pulses and normal pulses. Exam reveals no gallop, no S3 and no S4.  No murmur heard. Pulses:      Dorsalis pedis pulses are 2+ on the right side and 2+ on the left side.       Posterior tibial pulses are 2+ on the right side and 2+ on the left side.  Pulmonary/Chest: Effort normal and breath sounds normal. No stridor. She has no wheezes. She has no rales.  Abdominal: Soft. Bowel sounds are normal. She exhibits no distension. There is no abdominal tenderness.  Musculoskeletal:        General: No edema.     Cervical back: Neck supple.  Neurological: She is alert and oriented to person, place, and time. She has intact cranial nerves (2-12).  Skin: Skin is warm and moist.   CARDIAC DATABASE: EKG: 09/22/2022: Sinus rhythm, 61 bpm, left axis, without underlying injury pattern.  Echocardiogram: 10/17/2019 at [facility]: LVEF 65 to 70%, aortic valve sclerosis without stenosis normal diastolic function, trace MR, mild TR, RVSP 20-30 mmHg.  Stress Testing:  Lexiscan (Walking with mod Bruce) Sestamibi Stress Test 12/11/2019: Nondiagnostic ECG stress. Myocardial perfusion is normal with mild breast tissue attenuation in the inferior wall.  Stress LV EF: 68% without wall motion abnormality.  No previous exam available for comparison. Low risk study.   Heart Catheterization: None  Carotid duplex: 10/04/2019 at [facility]: No hemodynamics significant stenosis on either sides.  Both vertebral arteries are patent with antegrade flow.  18 - day Mobile Cardiac Ambulatory Telemetry: Enrollment Period: 12/25/2019 - 01/11/2020 Predominant rhythm normal sinus rhythm with ventricular rate ranging from 49 - 156 bpm,  average heart rate 71 bpm. No pauses greater than or equal to 2.5 seconds. No atrial fibrillation detected during the monitoring period.   Total supraventricular ectopic burden 0.01% (81 supraventricular ectopy, 3 supraventricular runs). Longest run was 6 beats in duration at 100 bpm and fastest run was 4 beats in duration at rate of 156 bpm.  Total ventricular ectopic burden 0.02% (193 ventricular ectopy, 1 ventricular pairs and 0 ventricular runs). Heart rate < 60 bpm for 10.8% of the recording. Heart rate > 100 bpm for 3.8% of the recording. 35 Patient triggered events reviewed, not associated with any significant arrhythmia and underlying rhythm was normal sinus.  LABORATORY DATA: External Labs: Collected: January 2021 Creatinine 0.8 mg/dL. eGFR: 79 mL/min per 1.73 m Lipid profile: Total cholesterol 155, triglycerides 125, HDL 41, LDL 89, non-HDL 114 Hemoglobin A1c: 7.9  FINAL MEDICATION LIST END OF ENCOUNTER: No orders of the defined types were placed in this encounter.   Medications Discontinued During This Encounter  Medication Reason  lisinopril (PRINIVIL,ZESTRIL) 40 MG tablet Change in therapy   magnesium oxide (MAG-OX) 400 MG tablet       Current Outpatient Medications:    aspirin EC 81 MG tablet, Take by mouth., Disp: , Rfl:    atorvastatin (LIPITOR) 10 MG tablet, Take 10 mg by mouth daily., Disp: , Rfl:    cholecalciferol (VITAMIN D) 25 MCG (1000 UT) tablet, Take by mouth., Disp: , Rfl:    famotidine (PEPCID) 20 MG tablet, Take by mouth., Disp: , Rfl:    FARXIGA 10 MG TABS tablet, Take 10 mg by mouth daily., Disp: , Rfl:    letrozole (FEMARA) 2.5 MG tablet, , Disp: , Rfl:    levothyroxine (SYNTHROID) 75 MCG tablet, Take 75 mcg by mouth daily before breakfast., Disp: , Rfl:    metFORMIN (GLUCOPHAGE-XR) 500 MG 24 hr tablet, Take 1,000 mg by mouth 2 (two) times daily., Disp: , Rfl:    metoprolol succinate (TOPROL-XL) 50 MG 24 hr tablet, TAKE 1 TAB EVERY MORNING *HOLD IF  SYSTOLIC BP (TOP #) IS LESS THAN 100 OR PULSE LESS THAN 60, Disp: 90 tablet, Rfl: 1   vitamin E 400 UNIT capsule, Take by mouth., Disp: , Rfl:    olmesartan (BENICAR) 40 MG tablet, Take 40 mg by mouth daily., Disp: , Rfl:   IMPRESSION:    ICD-10-CM   1. Palpitations  R00.2     2. Paroxysmal SVT (supraventricular tachycardia)  I47.10 EKG 12-Lead    3. NSVT (nonsustained ventricular tachycardia) (HCC)  I47.29 EKG 12-Lead    4. Type 2 diabetes mellitus without complication, without long-term current use of insulin (HCC)  E11.9     5. Benign hypertension  I10     6. Mixed hyperlipidemia  E78.2     7. Former smoker  Z87.891     68. Hx of breast cancer  Z85.3     9. History of stroke  Z86.73        RECOMMENDATIONS: Lauraine Crespo is a 57 y.o. female whose past medical history and cardiac risk factors include: History of breast cancer status post chemotherapy/radiation/surgery, history of CVA per MRI findings noted below, non-insulin-dependent diabetes mellitus type 2, mixed hyperlipidemia, hypertension, postmenopausal female, obesity.  Palpitations Paroxysmal SVT (supraventricular tachycardia) Hx of asymptomatic NSVT (nonsustained ventricular tachycardia) (HCC) Remains asymptomatic over the last 1 year. Has done well on Toprol-XL.  Prior echo and stress test results reviewed as part of today's office visit. No additional workup warranted at this time since she is asymptomatic. She will have her Toprol-XL refilled by her PCP going forward and will see me on as-needed basis.  Type 2 diabetes mellitus without complication, without long-term current use of insulin ([facility]) Reemphasized the importance of glycemic control. Currently on ARB, Farxiga, statin therapy. Recommend a goal SBP of 130 mmHg.  Benign hypertension Office blood pressures within acceptable limits but higher than the goal. Medications reconciled. She will keep an eye on her blood pressures at home and discuss  management with PCP  Mixed hyperlipidemia Currently on atorvastatin.   She denies myalgia or other side effects. Currently managed by primary care provider.  Orders Placed This Encounter  Procedures   EKG 12-Lead   --Continue cardiac medications as reconciled in final medication list. --Return if symptoms worsen or fail to improve. Or sooner if needed. --Continue follow-up with your primary care physician regarding the management of your other chronic comorbid conditions.  Patient's questions and concerns were addressed to her satisfaction. She voices understanding of  the instructions provided during this encounter.   This note was created using a voice recognition software as a result there may be grammatical errors inadvertently enclosed that do not reflect the nature of this encounter. Every attempt is made to correct such errors.  Total time spent: 30 minutes.  Rex Kras, [place], [facility]  Pager: [phone] Office: [phone]

## 2022-11-08 DIAGNOSIS — E782 Mixed hyperlipidemia: Secondary | ICD-10-CM | POA: Diagnosis not present

## 2022-11-08 DIAGNOSIS — K760 Fatty (change of) liver, not elsewhere classified: Secondary | ICD-10-CM | POA: Diagnosis not present

## 2022-11-08 DIAGNOSIS — E1165 Type 2 diabetes mellitus with hyperglycemia: Secondary | ICD-10-CM | POA: Diagnosis not present

## 2022-11-08 DIAGNOSIS — I10 Essential (primary) hypertension: Secondary | ICD-10-CM | POA: Diagnosis not present

## 2022-11-15 DIAGNOSIS — E782 Mixed hyperlipidemia: Secondary | ICD-10-CM | POA: Diagnosis not present

## 2022-11-15 DIAGNOSIS — E1165 Type 2 diabetes mellitus with hyperglycemia: Secondary | ICD-10-CM | POA: Diagnosis not present

## 2022-11-15 DIAGNOSIS — T783XXA Angioneurotic edema, initial encounter: Secondary | ICD-10-CM | POA: Diagnosis not present

## 2022-11-15 DIAGNOSIS — N182 Chronic kidney disease, stage 2 (mild): Secondary | ICD-10-CM | POA: Diagnosis not present

## 2022-12-13 DIAGNOSIS — N182 Chronic kidney disease, stage 2 (mild): Secondary | ICD-10-CM | POA: Diagnosis not present

## 2022-12-13 DIAGNOSIS — T783XXA Angioneurotic edema, initial encounter: Secondary | ICD-10-CM | POA: Diagnosis not present

## 2022-12-13 DIAGNOSIS — I10 Essential (primary) hypertension: Secondary | ICD-10-CM | POA: Diagnosis not present

## 2022-12-29 DIAGNOSIS — Z170 Estrogen receptor positive status [ER+]: Secondary | ICD-10-CM | POA: Diagnosis not present

## 2022-12-29 DIAGNOSIS — C50412 Malignant neoplasm of upper-outer quadrant of left female breast: Secondary | ICD-10-CM | POA: Diagnosis not present

## 2022-12-29 DIAGNOSIS — E559 Vitamin D deficiency, unspecified: Secondary | ICD-10-CM | POA: Diagnosis not present

## 2022-12-29 DIAGNOSIS — E118 Type 2 diabetes mellitus with unspecified complications: Secondary | ICD-10-CM | POA: Diagnosis not present

## 2023-01-05 DIAGNOSIS — J3081 Allergic rhinitis due to animal (cat) (dog) hair and dander: Secondary | ICD-10-CM | POA: Diagnosis not present

## 2023-01-05 DIAGNOSIS — T783XXD Angioneurotic edema, subsequent encounter: Secondary | ICD-10-CM | POA: Diagnosis not present

## 2023-01-05 DIAGNOSIS — J3089 Other allergic rhinitis: Secondary | ICD-10-CM | POA: Diagnosis not present

## 2023-01-05 DIAGNOSIS — J301 Allergic rhinitis due to pollen: Secondary | ICD-10-CM | POA: Diagnosis not present

## 2023-01-25 DIAGNOSIS — I10 Essential (primary) hypertension: Secondary | ICD-10-CM | POA: Diagnosis not present

## 2023-01-25 DIAGNOSIS — N182 Chronic kidney disease, stage 2 (mild): Secondary | ICD-10-CM | POA: Diagnosis not present

## 2023-01-25 DIAGNOSIS — T783XXA Angioneurotic edema, initial encounter: Secondary | ICD-10-CM | POA: Diagnosis not present

## 2023-01-27 DIAGNOSIS — N1831 Chronic kidney disease, stage 3a: Secondary | ICD-10-CM | POA: Diagnosis not present

## 2023-02-02 DIAGNOSIS — K760 Fatty (change of) liver, not elsewhere classified: Secondary | ICD-10-CM | POA: Diagnosis not present

## 2023-02-02 DIAGNOSIS — E1122 Type 2 diabetes mellitus with diabetic chronic kidney disease: Secondary | ICD-10-CM | POA: Diagnosis not present

## 2023-02-02 DIAGNOSIS — N1831 Chronic kidney disease, stage 3a: Secondary | ICD-10-CM | POA: Diagnosis not present

## 2023-02-02 DIAGNOSIS — I129 Hypertensive chronic kidney disease with stage 1 through stage 4 chronic kidney disease, or unspecified chronic kidney disease: Secondary | ICD-10-CM | POA: Diagnosis not present

## 2023-02-07 DIAGNOSIS — H04123 Dry eye syndrome of bilateral lacrimal glands: Secondary | ICD-10-CM | POA: Diagnosis not present

## 2023-02-07 DIAGNOSIS — H25813 Combined forms of age-related cataract, bilateral: Secondary | ICD-10-CM | POA: Diagnosis not present

## 2023-02-07 DIAGNOSIS — H0014 Chalazion left upper eyelid: Secondary | ICD-10-CM | POA: Diagnosis not present

## 2023-02-07 DIAGNOSIS — E119 Type 2 diabetes mellitus without complications: Secondary | ICD-10-CM | POA: Diagnosis not present

## 2023-02-07 DIAGNOSIS — H524 Presbyopia: Secondary | ICD-10-CM | POA: Diagnosis not present

## 2023-03-14 DIAGNOSIS — R350 Frequency of micturition: Secondary | ICD-10-CM | POA: Diagnosis not present

## 2023-03-27 DIAGNOSIS — E559 Vitamin D deficiency, unspecified: Secondary | ICD-10-CM | POA: Diagnosis not present

## 2023-04-04 DIAGNOSIS — N182 Chronic kidney disease, stage 2 (mild): Secondary | ICD-10-CM | POA: Diagnosis not present

## 2023-04-04 DIAGNOSIS — N390 Urinary tract infection, site not specified: Secondary | ICD-10-CM | POA: Diagnosis not present

## 2023-04-04 DIAGNOSIS — I10 Essential (primary) hypertension: Secondary | ICD-10-CM | POA: Diagnosis not present

## 2023-05-18 DIAGNOSIS — Z01419 Encounter for gynecological examination (general) (routine) without abnormal findings: Secondary | ICD-10-CM | POA: Diagnosis not present

## 2023-05-18 DIAGNOSIS — Z124 Encounter for screening for malignant neoplasm of cervix: Secondary | ICD-10-CM | POA: Diagnosis not present

## 2023-05-18 DIAGNOSIS — Z113 Encounter for screening for infections with a predominantly sexual mode of transmission: Secondary | ICD-10-CM | POA: Diagnosis not present

## 2023-05-18 DIAGNOSIS — N898 Other specified noninflammatory disorders of vagina: Secondary | ICD-10-CM | POA: Diagnosis not present

## 2023-05-18 DIAGNOSIS — C50412 Malignant neoplasm of upper-outer quadrant of left female breast: Secondary | ICD-10-CM | POA: Diagnosis not present

## 2023-05-24 ENCOUNTER — Ambulatory Visit: Payer: Federal, State, Local not specified - PPO | Admitting: Nurse Practitioner

## 2023-06-27 DIAGNOSIS — E11649 Type 2 diabetes mellitus with hypoglycemia without coma: Secondary | ICD-10-CM | POA: Diagnosis not present

## 2023-06-27 DIAGNOSIS — I10 Essential (primary) hypertension: Secondary | ICD-10-CM | POA: Diagnosis not present

## 2023-06-27 DIAGNOSIS — E063 Autoimmune thyroiditis: Secondary | ICD-10-CM | POA: Diagnosis not present

## 2023-06-27 DIAGNOSIS — E1165 Type 2 diabetes mellitus with hyperglycemia: Secondary | ICD-10-CM | POA: Diagnosis not present

## 2023-06-27 DIAGNOSIS — E782 Mixed hyperlipidemia: Secondary | ICD-10-CM | POA: Diagnosis not present

## 2023-06-29 DIAGNOSIS — Z1231 Encounter for screening mammogram for malignant neoplasm of breast: Secondary | ICD-10-CM | POA: Diagnosis not present

## 2023-06-29 DIAGNOSIS — C50412 Malignant neoplasm of upper-outer quadrant of left female breast: Secondary | ICD-10-CM | POA: Diagnosis not present

## 2023-06-29 DIAGNOSIS — Z170 Estrogen receptor positive status [ER+]: Secondary | ICD-10-CM | POA: Diagnosis not present

## 2023-06-29 DIAGNOSIS — E559 Vitamin D deficiency, unspecified: Secondary | ICD-10-CM | POA: Diagnosis not present

## 2023-07-11 DIAGNOSIS — E1165 Type 2 diabetes mellitus with hyperglycemia: Secondary | ICD-10-CM | POA: Diagnosis not present

## 2023-07-11 DIAGNOSIS — N182 Chronic kidney disease, stage 2 (mild): Secondary | ICD-10-CM | POA: Diagnosis not present

## 2023-07-11 DIAGNOSIS — E1121 Type 2 diabetes mellitus with diabetic nephropathy: Secondary | ICD-10-CM | POA: Diagnosis not present

## 2023-07-11 DIAGNOSIS — Z0000 Encounter for general adult medical examination without abnormal findings: Secondary | ICD-10-CM | POA: Diagnosis not present

## 2023-07-11 DIAGNOSIS — K760 Fatty (change of) liver, not elsewhere classified: Secondary | ICD-10-CM | POA: Diagnosis not present

## 2023-07-20 DIAGNOSIS — C50412 Malignant neoplasm of upper-outer quadrant of left female breast: Secondary | ICD-10-CM | POA: Diagnosis not present

## 2023-07-20 DIAGNOSIS — L900 Lichen sclerosus et atrophicus: Secondary | ICD-10-CM | POA: Diagnosis not present

## 2023-07-20 DIAGNOSIS — Z170 Estrogen receptor positive status [ER+]: Secondary | ICD-10-CM | POA: Diagnosis not present

## 2023-07-20 DIAGNOSIS — I10 Essential (primary) hypertension: Secondary | ICD-10-CM | POA: Diagnosis not present

## 2023-07-20 DIAGNOSIS — Z853 Personal history of malignant neoplasm of breast: Secondary | ICD-10-CM | POA: Diagnosis not present

## 2023-08-09 DIAGNOSIS — N3000 Acute cystitis without hematuria: Secondary | ICD-10-CM | POA: Diagnosis not present

## 2023-08-09 DIAGNOSIS — R2231 Localized swelling, mass and lump, right upper limb: Secondary | ICD-10-CM | POA: Diagnosis not present

## 2023-08-21 DIAGNOSIS — R2231 Localized swelling, mass and lump, right upper limb: Secondary | ICD-10-CM | POA: Diagnosis not present

## 2023-09-13 DIAGNOSIS — N1831 Chronic kidney disease, stage 3a: Secondary | ICD-10-CM | POA: Diagnosis not present

## 2023-09-13 DIAGNOSIS — N182 Chronic kidney disease, stage 2 (mild): Secondary | ICD-10-CM | POA: Diagnosis not present

## 2023-09-13 DIAGNOSIS — I129 Hypertensive chronic kidney disease with stage 1 through stage 4 chronic kidney disease, or unspecified chronic kidney disease: Secondary | ICD-10-CM | POA: Diagnosis not present

## 2023-09-13 DIAGNOSIS — E1122 Type 2 diabetes mellitus with diabetic chronic kidney disease: Secondary | ICD-10-CM | POA: Diagnosis not present

## 2023-09-14 DIAGNOSIS — R92323 Mammographic fibroglandular density, bilateral breasts: Secondary | ICD-10-CM | POA: Diagnosis not present

## 2023-09-14 DIAGNOSIS — Z1231 Encounter for screening mammogram for malignant neoplasm of breast: Secondary | ICD-10-CM | POA: Diagnosis not present

## 2023-10-18 DIAGNOSIS — E1122 Type 2 diabetes mellitus with diabetic chronic kidney disease: Secondary | ICD-10-CM | POA: Diagnosis not present

## 2023-10-18 DIAGNOSIS — K760 Fatty (change of) liver, not elsewhere classified: Secondary | ICD-10-CM | POA: Diagnosis not present

## 2023-10-18 DIAGNOSIS — I129 Hypertensive chronic kidney disease with stage 1 through stage 4 chronic kidney disease, or unspecified chronic kidney disease: Secondary | ICD-10-CM | POA: Diagnosis not present

## 2023-10-18 DIAGNOSIS — N182 Chronic kidney disease, stage 2 (mild): Secondary | ICD-10-CM | POA: Diagnosis not present

## 2023-10-18 DIAGNOSIS — N1831 Chronic kidney disease, stage 3a: Secondary | ICD-10-CM | POA: Diagnosis not present

## 2023-11-09 DIAGNOSIS — I129 Hypertensive chronic kidney disease with stage 1 through stage 4 chronic kidney disease, or unspecified chronic kidney disease: Secondary | ICD-10-CM | POA: Diagnosis not present

## 2023-11-16 DIAGNOSIS — Z1211 Encounter for screening for malignant neoplasm of colon: Secondary | ICD-10-CM | POA: Diagnosis not present

## 2023-11-16 DIAGNOSIS — I10 Essential (primary) hypertension: Secondary | ICD-10-CM | POA: Diagnosis not present

## 2023-11-16 DIAGNOSIS — E119 Type 2 diabetes mellitus without complications: Secondary | ICD-10-CM | POA: Diagnosis not present

## 2023-11-16 DIAGNOSIS — Z1330 Encounter for screening examination for mental health and behavioral disorders, unspecified: Secondary | ICD-10-CM | POA: Diagnosis not present

## 2023-11-16 DIAGNOSIS — E039 Hypothyroidism, unspecified: Secondary | ICD-10-CM | POA: Diagnosis not present

## 2023-12-04 DIAGNOSIS — E041 Nontoxic single thyroid nodule: Secondary | ICD-10-CM | POA: Diagnosis not present

## 2023-12-13 DIAGNOSIS — Z1211 Encounter for screening for malignant neoplasm of colon: Secondary | ICD-10-CM | POA: Diagnosis not present

## 2023-12-13 DIAGNOSIS — K760 Fatty (change of) liver, not elsewhere classified: Secondary | ICD-10-CM | POA: Diagnosis not present

## 2023-12-13 DIAGNOSIS — R1319 Other dysphagia: Secondary | ICD-10-CM | POA: Diagnosis not present

## 2023-12-13 DIAGNOSIS — K219 Gastro-esophageal reflux disease without esophagitis: Secondary | ICD-10-CM | POA: Diagnosis not present

## 2024-01-01 DIAGNOSIS — D696 Thrombocytopenia, unspecified: Secondary | ICD-10-CM | POA: Diagnosis not present

## 2024-01-01 DIAGNOSIS — Z79811 Long term (current) use of aromatase inhibitors: Secondary | ICD-10-CM | POA: Diagnosis not present

## 2024-01-01 DIAGNOSIS — C50412 Malignant neoplasm of upper-outer quadrant of left female breast: Secondary | ICD-10-CM | POA: Diagnosis not present

## 2024-01-01 DIAGNOSIS — Z170 Estrogen receptor positive status [ER+]: Secondary | ICD-10-CM | POA: Diagnosis not present

## 2024-01-01 DIAGNOSIS — E559 Vitamin D deficiency, unspecified: Secondary | ICD-10-CM | POA: Diagnosis not present

## 2024-01-01 DIAGNOSIS — Z5181 Encounter for therapeutic drug level monitoring: Secondary | ICD-10-CM | POA: Diagnosis not present

## 2024-01-12 DIAGNOSIS — N182 Chronic kidney disease, stage 2 (mild): Secondary | ICD-10-CM | POA: Diagnosis not present

## 2024-01-16 DIAGNOSIS — K760 Fatty (change of) liver, not elsewhere classified: Secondary | ICD-10-CM | POA: Diagnosis not present

## 2024-01-16 DIAGNOSIS — N1831 Chronic kidney disease, stage 3a: Secondary | ICD-10-CM | POA: Diagnosis not present

## 2024-01-16 DIAGNOSIS — E1122 Type 2 diabetes mellitus with diabetic chronic kidney disease: Secondary | ICD-10-CM | POA: Diagnosis not present

## 2024-01-16 DIAGNOSIS — I129 Hypertensive chronic kidney disease with stage 1 through stage 4 chronic kidney disease, or unspecified chronic kidney disease: Secondary | ICD-10-CM | POA: Diagnosis not present

## 2024-02-02 DIAGNOSIS — D120 Benign neoplasm of cecum: Secondary | ICD-10-CM | POA: Diagnosis not present

## 2024-02-02 DIAGNOSIS — K2100 Gastro-esophageal reflux disease with esophagitis, without bleeding: Secondary | ICD-10-CM | POA: Diagnosis not present

## 2024-02-02 DIAGNOSIS — K219 Gastro-esophageal reflux disease without esophagitis: Secondary | ICD-10-CM | POA: Diagnosis not present

## 2024-02-02 DIAGNOSIS — K2950 Unspecified chronic gastritis without bleeding: Secondary | ICD-10-CM | POA: Diagnosis not present

## 2024-02-02 DIAGNOSIS — R1310 Dysphagia, unspecified: Secondary | ICD-10-CM | POA: Diagnosis not present

## 2024-02-02 DIAGNOSIS — K621 Rectal polyp: Secondary | ICD-10-CM | POA: Diagnosis not present

## 2024-02-02 DIAGNOSIS — Z1211 Encounter for screening for malignant neoplasm of colon: Secondary | ICD-10-CM | POA: Diagnosis not present

## 2024-02-16 DIAGNOSIS — E119 Type 2 diabetes mellitus without complications: Secondary | ICD-10-CM | POA: Diagnosis not present

## 2024-02-16 DIAGNOSIS — H25813 Combined forms of age-related cataract, bilateral: Secondary | ICD-10-CM | POA: Diagnosis not present

## 2024-02-19 DIAGNOSIS — E119 Type 2 diabetes mellitus without complications: Secondary | ICD-10-CM | POA: Diagnosis not present

## 2024-02-19 DIAGNOSIS — R110 Nausea: Secondary | ICD-10-CM | POA: Diagnosis not present

## 2024-02-19 DIAGNOSIS — R252 Cramp and spasm: Secondary | ICD-10-CM | POA: Diagnosis not present

## 2024-02-19 DIAGNOSIS — R29898 Other symptoms and signs involving the musculoskeletal system: Secondary | ICD-10-CM | POA: Diagnosis not present

## 2024-04-19 DIAGNOSIS — R5383 Other fatigue: Secondary | ICD-10-CM | POA: Diagnosis not present

## 2024-04-19 DIAGNOSIS — E119 Type 2 diabetes mellitus without complications: Secondary | ICD-10-CM | POA: Diagnosis not present

## 2024-04-30 DIAGNOSIS — R81 Glycosuria: Secondary | ICD-10-CM | POA: Diagnosis not present

## 2024-04-30 DIAGNOSIS — M549 Dorsalgia, unspecified: Secondary | ICD-10-CM | POA: Diagnosis not present

## 2024-04-30 DIAGNOSIS — M5414 Radiculopathy, thoracic region: Secondary | ICD-10-CM | POA: Diagnosis not present

## 2024-05-09 DIAGNOSIS — C50412 Malignant neoplasm of upper-outer quadrant of left female breast: Secondary | ICD-10-CM | POA: Diagnosis not present

## 2024-05-09 DIAGNOSIS — Z170 Estrogen receptor positive status [ER+]: Secondary | ICD-10-CM | POA: Diagnosis not present

## 2024-05-17 DIAGNOSIS — E119 Type 2 diabetes mellitus without complications: Secondary | ICD-10-CM | POA: Diagnosis not present

## 2024-05-17 DIAGNOSIS — M549 Dorsalgia, unspecified: Secondary | ICD-10-CM | POA: Diagnosis not present

## 2024-06-28 DIAGNOSIS — K219 Gastro-esophageal reflux disease without esophagitis: Secondary | ICD-10-CM | POA: Diagnosis not present

## 2024-06-28 DIAGNOSIS — E119 Type 2 diabetes mellitus without complications: Secondary | ICD-10-CM | POA: Diagnosis not present

## 2024-06-28 DIAGNOSIS — I10 Essential (primary) hypertension: Secondary | ICD-10-CM | POA: Diagnosis not present

## 2024-06-28 DIAGNOSIS — Z0000 Encounter for general adult medical examination without abnormal findings: Secondary | ICD-10-CM | POA: Diagnosis not present

## 2024-06-28 DIAGNOSIS — R944 Abnormal results of kidney function studies: Secondary | ICD-10-CM | POA: Diagnosis not present

## 2024-06-28 DIAGNOSIS — Z13220 Encounter for screening for lipoid disorders: Secondary | ICD-10-CM | POA: Diagnosis not present

## 2024-07-30 DIAGNOSIS — Z853 Personal history of malignant neoplasm of breast: Secondary | ICD-10-CM | POA: Diagnosis not present

## 2024-07-30 DIAGNOSIS — R42 Dizziness and giddiness: Secondary | ICD-10-CM | POA: Diagnosis not present
